# Patient Record
Sex: Female | Born: 1995 | Hispanic: No | Marital: Married | Smoking: Never smoker
Health system: Southern US, Community
[De-identification: ages and names within clinical notes are randomized; demographics above are authoritative.]

## PROBLEM LIST (undated history)

## (undated) DIAGNOSIS — O139 Gestational [pregnancy-induced] hypertension without significant proteinuria, unspecified trimester: Secondary | ICD-10-CM

## (undated) DIAGNOSIS — Z789 Other specified health status: Secondary | ICD-10-CM

## (undated) DIAGNOSIS — K219 Gastro-esophageal reflux disease without esophagitis: Secondary | ICD-10-CM

## (undated) HISTORY — DX: Gestational (pregnancy-induced) hypertension without significant proteinuria, unspecified trimester: O13.9

## (undated) HISTORY — DX: Gastro-esophageal reflux disease without esophagitis: K21.9

---
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
## 2015-02-24 LAB — OB RESULTS CONSOLE GBS: STREP GROUP B AG: NEGATIVE

## 2015-03-30 ENCOUNTER — Encounter (HOSPITAL_COMMUNITY): Payer: Self-pay | Admitting: *Deleted

## 2015-03-30 ENCOUNTER — Inpatient Hospital Stay (HOSPITAL_COMMUNITY)
Admission: AD | Admit: 2015-03-30 | Discharge: 2015-04-03 | DRG: 775 | Disposition: A | Discharge status: Home / Self Care | Payer: Medicaid Other | Source: Ambulatory Visit | Attending: Obstetrics and Gynecology | Admitting: Obstetrics and Gynecology

## 2015-03-30 DIAGNOSIS — O1490 Unspecified pre-eclampsia, unspecified trimester: Secondary | ICD-10-CM | POA: Diagnosis present

## 2015-03-30 DIAGNOSIS — O139 Gestational [pregnancy-induced] hypertension without significant proteinuria, unspecified trimester: Secondary | ICD-10-CM | POA: Diagnosis present

## 2015-03-30 DIAGNOSIS — O480 Post-term pregnancy: Principal | ICD-10-CM | POA: Diagnosis present

## 2015-03-30 DIAGNOSIS — Z3A40 40 weeks gestation of pregnancy: Secondary | ICD-10-CM | POA: Diagnosis present

## 2015-03-30 DIAGNOSIS — Z3480 Encounter for supervision of other normal pregnancy, unspecified trimester: Secondary | ICD-10-CM

## 2015-03-30 DIAGNOSIS — O133 Gestational [pregnancy-induced] hypertension without significant proteinuria, third trimester: Secondary | ICD-10-CM | POA: Diagnosis present

## 2015-03-30 HISTORY — DX: Other specified health status: Z78.9

## 2015-03-30 LAB — OB RESULTS CONSOLE ANTIBODY SCREEN: Antibody Screen: NEGATIVE

## 2015-03-30 LAB — CBC
HEMATOCRIT: 37.7 % (ref 36.0–46.0)
Hemoglobin: 12.5 g/dL (ref 12.0–15.0)
MCH: 29.1 pg (ref 26.0–34.0)
MCHC: 33.2 g/dL (ref 30.0–36.0)
MCV: 87.9 fL (ref 78.0–100.0)
PLATELETS: 289 10*3/uL (ref 150–400)
RBC: 4.29 MIL/uL (ref 3.87–5.11)
RDW: 14.6 % (ref 11.5–15.5)
WBC: 8.5 10*3/uL (ref 4.0–10.5)

## 2015-03-30 LAB — TYPE AND SCREEN
ABO/RH(D): A POS
ANTIBODY SCREEN: NEGATIVE

## 2015-03-30 LAB — COMPREHENSIVE METABOLIC PANEL
ALT: 26 U/L (ref 14–54)
ANION GAP: 11 (ref 5–15)
AST: 30 U/L (ref 15–41)
Albumin: 3.2 g/dL — ABNORMAL LOW (ref 3.5–5.0)
Alkaline Phosphatase: 279 U/L — ABNORMAL HIGH (ref 38–126)
BILIRUBIN TOTAL: 0.2 mg/dL — AB (ref 0.3–1.2)
BUN: 10 mg/dL (ref 6–20)
CALCIUM: 9.4 mg/dL (ref 8.9–10.3)
CO2: 21 mmol/L — ABNORMAL LOW (ref 22–32)
Chloride: 100 mmol/L — ABNORMAL LOW (ref 101–111)
Creatinine, Ser: 0.53 mg/dL (ref 0.44–1.00)
GFR calc Af Amer: >60 mL/min (ref >60)
Glucose, Bld: 74 mg/dL (ref 65–99)
POTASSIUM: 4 mmol/L (ref 3.5–5.1)
Sodium: 132 mmol/L — ABNORMAL LOW (ref 135–145)
TOTAL PROTEIN: 6.7 g/dL (ref 6.5–8.1)

## 2015-03-30 LAB — OB RESULTS CONSOLE ABO/RH: RH TYPE: POSITIVE

## 2015-03-30 LAB — OB RESULTS CONSOLE RPR: RPR: NONREACTIVE

## 2015-03-30 LAB — OB RESULTS CONSOLE HIV ANTIBODY (ROUTINE TESTING): HIV: NONREACTIVE

## 2015-03-30 LAB — LACTATE DEHYDROGENASE: LDH: 142 U/L (ref 98–192)

## 2015-03-30 LAB — OB RESULTS CONSOLE RUBELLA ANTIBODY, IGM: Rubella: IMMUNE

## 2015-03-30 LAB — URIC ACID: Uric Acid, Serum: 5.3 mg/dL (ref 2.3–6.6)

## 2015-03-30 LAB — OB RESULTS CONSOLE HEPATITIS B SURFACE ANTIGEN: Hepatitis B Surface Ag: NEGATIVE

## 2015-03-30 LAB — OB RESULTS CONSOLE GC/CHLAMYDIA
CHLAMYDIA, DNA PROBE: NEGATIVE
GC PROBE AMP, GENITAL: NEGATIVE

## 2015-03-30 MED ORDER — OXYCODONE-ACETAMINOPHEN 5-325 MG PO TABS: 1.0000 | ORAL_TABLET | ORAL | Status: DC | PRN

## 2015-03-30 MED ORDER — MISOPROSTOL 25 MCG QUARTER TABLET
25.0000 ug | ORAL_TABLET | ORAL | Status: DC | PRN
  Administered 2015-03-30: 25 ug via VAGINAL
  Filled 2015-03-30: qty 0.25
  Filled 2015-03-30: qty 1
  Filled 2015-03-30 (×2): qty 0.25

## 2015-03-30 MED ORDER — LACTATED RINGERS IV SOLN
INTRAVENOUS | Status: DC
  Administered 2015-03-30 – 2015-03-31 (×4): via INTRAVENOUS

## 2015-03-30 MED ORDER — ONDANSETRON HCL 4 MG/2ML IJ SOLN: 4.0000 mg | Freq: Four times a day (QID) | INTRAMUSCULAR | Status: DC | PRN

## 2015-03-30 MED ORDER — TERBUTALINE SULFATE 1 MG/ML IJ SOLN
0.2500 mg | Freq: Once | INTRAMUSCULAR | Status: DC | PRN
  Filled 2015-03-30: qty 1

## 2015-03-30 MED ORDER — OXYTOCIN 40 UNITS IN LACTATED RINGERS INFUSION - SIMPLE MED
62.5000 mL/h | INTRAVENOUS | Status: DC
  Filled 2015-03-30: qty 1000

## 2015-03-30 MED ORDER — LACTATED RINGERS IV SOLN
500.0000 mL | INTRAVENOUS | Status: DC | PRN
  Administered 2015-03-31: 500 mL via INTRAVENOUS
  Administered 2015-03-31: 300 mL via INTRAVENOUS

## 2015-03-30 MED ORDER — CITRIC ACID-SODIUM CITRATE 334-500 MG/5ML PO SOLN: 30.0000 mL | ORAL | Status: DC | PRN

## 2015-03-30 MED ORDER — ZOLPIDEM TARTRATE 5 MG PO TABS: 5.0000 mg | ORAL_TABLET | Freq: Every evening | ORAL | Status: DC | PRN

## 2015-03-30 MED ORDER — BUTORPHANOL TARTRATE 1 MG/ML IJ SOLN
1.0000 mg | INTRAMUSCULAR | Status: DC | PRN
  Administered 2015-03-31 (×2): 1 mg via INTRAVENOUS
  Filled 2015-03-30 (×2): qty 1

## 2015-03-30 MED ORDER — ACETAMINOPHEN 325 MG PO TABS: 650.0000 mg | ORAL_TABLET | ORAL | Status: DC | PRN

## 2015-03-30 MED ORDER — OXYCODONE-ACETAMINOPHEN 5-325 MG PO TABS: 2.0000 | ORAL_TABLET | ORAL | Status: DC | PRN

## 2015-03-30 MED ORDER — LIDOCAINE HCL (PF) 1 % IJ SOLN
30.0000 mL | INTRAMUSCULAR | Status: DC | PRN
  Filled 2015-03-30: qty 30

## 2015-03-30 MED ORDER — OXYTOCIN BOLUS FROM INFUSION
500.0000 mL | INTRAVENOUS | Status: DC
  Administered 2015-03-31: 500 mL via INTRAVENOUS

## 2015-03-31 ENCOUNTER — Inpatient Hospital Stay (HOSPITAL_COMMUNITY): Payer: Medicaid Other | Admitting: Anesthesiology

## 2015-03-31 ENCOUNTER — Encounter (HOSPITAL_COMMUNITY): Payer: Self-pay

## 2015-03-31 LAB — CBC
HCT: 34.8 % — ABNORMAL LOW (ref 36.0–46.0)
HEMOGLOBIN: 11.5 g/dL — AB (ref 12.0–15.0)
MCH: 29 pg (ref 26.0–34.0)
MCHC: 33 g/dL (ref 30.0–36.0)
MCV: 87.7 fL (ref 78.0–100.0)
PLATELETS: 250 10*3/uL (ref 150–400)
RBC: 3.97 MIL/uL (ref 3.87–5.11)
RDW: 14.5 % (ref 11.5–15.5)
WBC: 13.5 10*3/uL — AB (ref 4.0–10.5)

## 2015-03-31 LAB — RPR: RPR Ser Ql: NONREACTIVE

## 2015-03-31 LAB — ABO/RH: ABO/RH(D): A POS

## 2015-03-31 MED ORDER — DIPHENHYDRAMINE HCL 50 MG/ML IJ SOLN: 12.5000 mg | INTRAMUSCULAR | Status: DC | PRN

## 2015-03-31 MED ORDER — ACETAMINOPHEN 500 MG PO TABS
1000.0000 mg | ORAL_TABLET | Freq: Four times a day (QID) | ORAL | Status: DC | PRN
  Administered 2015-03-31: 1000 mg via ORAL
  Filled 2015-03-31: qty 2

## 2015-03-31 MED ORDER — SODIUM CHLORIDE 0.9 % IV SOLN: 2.0000 g | Freq: Four times a day (QID) | INTRAVENOUS | Status: DC

## 2015-03-31 MED ORDER — PHENYLEPHRINE 40 MCG/ML (10ML) SYRINGE FOR IV PUSH (FOR BLOOD PRESSURE SUPPORT)
80.0000 ug | PREFILLED_SYRINGE | INTRAVENOUS | Status: DC | PRN
  Filled 2015-03-31: qty 2
  Filled 2015-03-31: qty 20

## 2015-03-31 MED ORDER — LIDOCAINE HCL (PF) 1 % IJ SOLN
INTRAMUSCULAR | Status: DC | PRN
  Administered 2015-03-31 (×2): 4 mL

## 2015-03-31 MED ORDER — SODIUM CHLORIDE 0.9 % IV SOLN
2.0000 g | Freq: Four times a day (QID) | INTRAVENOUS | Status: DC
  Administered 2015-03-31: 2 g via INTRAVENOUS
  Filled 2015-03-31 (×4): qty 2000

## 2015-03-31 MED ORDER — EPHEDRINE 5 MG/ML INJ
10.0000 mg | INTRAVENOUS | Status: DC | PRN
  Filled 2015-03-31: qty 2

## 2015-03-31 MED ORDER — OXYTOCIN 40 UNITS IN LACTATED RINGERS INFUSION - SIMPLE MED
1.0000 m[IU]/min | INTRAVENOUS | Status: DC
  Administered 2015-03-31: 2 m[IU]/min via INTRAVENOUS
  Administered 2015-03-31: 8 m[IU]/min via INTRAVENOUS

## 2015-03-31 MED ORDER — FENTANYL 2.5 MCG/ML BUPIVACAINE 1/10 % EPIDURAL INFUSION (WH - ANES)
14.0000 mL/h | INTRAMUSCULAR | Status: DC | PRN
  Administered 2015-03-31 (×3): 14 mL/h via EPIDURAL
  Filled 2015-03-31 (×2): qty 125

## 2015-03-31 MED ORDER — TERBUTALINE SULFATE 1 MG/ML IJ SOLN
0.2500 mg | Freq: Once | INTRAMUSCULAR | Status: DC | PRN
  Filled 2015-03-31: qty 1

## 2015-03-31 NOTE — Anesthesia Procedure Notes (Signed)
Epidural Patient location during procedure: OB  Staffing Anesthesiologist: Rosealynn Mateus Performed by: anesthesiologist   Preanesthetic Checklist Completed: patient identified, site marked, surgical consent, pre-op evaluation, timeout performed, IV checked, risks and benefits discussed and monitors and equipment checked  Epidural Patient position: sitting Prep: site prepped and draped and DuraPrep Patient monitoring: continuous pulse ox and blood pressure Approach: midline Location: L3-L4 Injection technique: LOR saline  Needle:  Needle type: Tuohy  Needle gauge: 17 G Needle length: 9 cm and 9 Needle insertion depth: 5 cm cm Catheter type: closed end flexible Catheter size: 19 Gauge Catheter at skin depth: 10 cm Test dose: negative  Assessment Events: blood not aspirated, injection not painful, no injection resistance, negative IV test and no paresthesia  Additional Notes Patient identified. Risks/Benefits/Options discussed with patient including but not limited to bleeding, infection, nerve damage, paralysis, failed block, incomplete pain control, headache, blood pressure changes, nausea, vomiting, reactions to medication both or allergic, itching and postpartum back pain. Confirmed with bedside nurse the patient's most recent platelet count. Confirmed with patient that they are not currently taking any anticoagulation, have any bleeding history or any family history of bleeding disorders. Patient expressed understanding and wished to proceed. All questions were answered. Sterile technique was used throughout the entire procedure. Please see nursing notes for vital signs. Test dose was given through epidural catheter and negative prior to continuing to dose epidural or start infusion. Warning signs of high block given to the patient including shortness of breath, tingling/numbness in hands, complete motor block, or any concerning symptoms with instructions to call for help. Patient was  given instructions on fall risk and not to get out of bed. All questions and concerns addressed with instructions to call with any issues or inadequate analgesia.      

## 2015-03-31 NOTE — Anesthesia Preprocedure Evaluation (Signed)
Anesthesia Evaluation  Patient identified by MRN, date of birth, ID band Patient awake    Reviewed: Allergy & Precautions, NPO status , Patient's Chart, lab work & pertinent test results  History of Anesthesia Complications Negative for: history of anesthetic complications  Airway Mallampati: II  TM Distance: >3 FB Neck ROM: Full    Dental no notable dental hx. (+) Dental Advisory Given   Pulmonary neg pulmonary ROS,    Pulmonary exam normal breath sounds clear to auscultation       Cardiovascular hypertension, Normal cardiovascular exam Rhythm:Regular Rate:Normal     Neuro/Psych negative neurological ROS  negative psych ROS   GI/Hepatic negative GI ROS, Neg liver ROS,   Endo/Other  negative endocrine ROS  Renal/GU negative Renal ROS  negative genitourinary   Musculoskeletal negative musculoskeletal ROS (+)   Abdominal   Peds negative pediatric ROS (+)  Hematology negative hematology ROS (+)   Anesthesia Other Findings   Reproductive/Obstetrics (+) Pregnancy                             Anesthesia Physical Anesthesia Plan  ASA: II  Anesthesia Plan: Epidural   Post-op Pain Management:    Induction:   Airway Management Planned:   Additional Equipment:   Intra-op Plan:   Post-operative Plan:   Informed Consent: I have reviewed the patients History and Physical, chart, labs and discussed the procedure including the risks, benefits and alternatives for the proposed anesthesia with the patient or authorized representative who has indicated his/her understanding and acceptance.   Dental advisory given  Plan Discussed with: CRNA  Anesthesia Plan Comments:         Anesthesia Quick Evaluation  

## 2015-03-31 NOTE — H&P (Signed)
Diamond Smith is a 19 y.o. female presenting for induction of labor for gestational hypertension  19 yo G2P0 At 4+2 sent over from the office for induction of labor for gestational hypertension. The patient had been seen yesterday for a routine prenatal visit in the office yesterday. Her initial BP was 150/90. She returned to the office for an appointment today and her initial BP was 140/90 therefore she was sent for induction History OB History    Gravida Para Term Preterm AB TAB SAB Ectopic Multiple Living       Past Medical History  Diagnosis Date  . Medical history non-contributory    History reviewed. No pertinent past surgical history. Family History: family history is not on file. Social History:  reports that she has never smoked. She does not have any smokeless tobacco history on file. She reports that she does not drink alcohol. Her drug history is not on file.   Prenatal Transfer Tool  Maternal Diabetes: No Genetic Screening: Normal Maternal Ultrasounds/Referrals: Normal Fetal Ultrasounds or other Referrals:  None Maternal Substance Abuse:  No Significant Maternal Medications:  None Significant Maternal Lab Results:  None Other Comments:  None  ROS  Dilation: 1 Effacement (%): 50 Station: -2, -3 Exam by:: N Deal RN Blood pressure 132/86, pulse 83, temperature 97.6 F (36.4 C), temperature source Oral, resp. rate 18, height 5' (1.524 m), weight 78.019 kg (172 lb), last menstrual period 06/21/2014. Exam Physical Exam  Prenatal labs: ABO, Rh: --/--/A POS, A POS (09/13 1900) Antibody: NEG (09/13 1900) Rubella: Immune (09/13 1807) RPR: Nonreactive (09/13 1807)  HBsAg: Negative (09/13 1807)  HIV: Non-reactive (09/13 1807)  GBS: Negative (08/10 0000)   Assessment/Plan: 1) Admit 2) PIH labs 3) Epidural on request 4) Misoprostal PV Q 4 hrs 5) antihypertensives as needed  Diamond Smith H. 03/31/2015, 8:00 AM

## 2015-04-01 ENCOUNTER — Encounter (HOSPITAL_COMMUNITY): Payer: Self-pay

## 2015-04-01 DIAGNOSIS — Z3480 Encounter for supervision of other normal pregnancy, unspecified trimester: Secondary | ICD-10-CM

## 2015-04-01 LAB — CBC
HCT: 29.9 % — ABNORMAL LOW (ref 36.0–46.0)
Hemoglobin: 9.8 g/dL — ABNORMAL LOW (ref 12.0–15.0)
MCH: 28.6 pg (ref 26.0–34.0)
MCHC: 32.8 g/dL (ref 30.0–36.0)
MCV: 87.2 fL (ref 78.0–100.0)
PLATELETS: 217 10*3/uL (ref 150–400)
RBC: 3.43 MIL/uL — ABNORMAL LOW (ref 3.87–5.11)
RDW: 14.7 % (ref 11.5–15.5)
WBC: 15.7 10*3/uL — ABNORMAL HIGH (ref 4.0–10.5)

## 2015-04-01 MED ORDER — OXYCODONE-ACETAMINOPHEN 5-325 MG PO TABS: 1.0000 | ORAL_TABLET | ORAL | Status: DC | PRN

## 2015-04-01 MED ORDER — ACETAMINOPHEN 325 MG PO TABS
650.0000 mg | ORAL_TABLET | ORAL | Status: DC | PRN
Start: 2015-04-01 — End: 2015-04-03
  Administered 2015-04-02: 650 mg via ORAL
  Filled 2015-04-01: qty 2

## 2015-04-01 MED ORDER — MEASLES, MUMPS & RUBELLA VAC ~~LOC~~ INJ
0.5000 mL | INJECTION | Freq: Once | SUBCUTANEOUS | Status: DC
  Filled 2015-04-01: qty 0.5

## 2015-04-01 MED ORDER — TETANUS-DIPHTH-ACELL PERTUSSIS 5-2.5-18.5 LF-MCG/0.5 IM SUSP: 0.5000 mL | Freq: Once | INTRAMUSCULAR | Status: DC

## 2015-04-01 MED ORDER — PRENATAL MULTIVITAMIN CH
1.0000 | ORAL_TABLET | Freq: Every day | ORAL | Status: DC
  Administered 2015-04-01 – 2015-04-02 (×2): 1 via ORAL
  Filled 2015-04-01 (×2): qty 1

## 2015-04-01 MED ORDER — ZOLPIDEM TARTRATE 5 MG PO TABS
5.0000 mg | ORAL_TABLET | Freq: Every evening | ORAL | Status: DC | PRN
Start: 2015-04-01 — End: 2015-04-03

## 2015-04-01 MED ORDER — SIMETHICONE 80 MG PO CHEW: 80.0000 mg | CHEWABLE_TABLET | ORAL | Status: DC | PRN

## 2015-04-01 MED ORDER — IBUPROFEN 600 MG PO TABS
600.0000 mg | ORAL_TABLET | Freq: Four times a day (QID) | ORAL | Status: DC
  Administered 2015-04-01 – 2015-04-03 (×8): 600 mg via ORAL
  Filled 2015-04-01 (×9): qty 1

## 2015-04-01 MED ORDER — INFLUENZA VAC SPLIT QUAD 0.5 ML IM SUSY: 0.5000 mL | PREFILLED_SYRINGE | INTRAMUSCULAR | Status: DC

## 2015-04-01 MED ORDER — ONDANSETRON HCL 4 MG/2ML IJ SOLN: 4.0000 mg | INTRAMUSCULAR | Status: DC | PRN

## 2015-04-01 MED ORDER — BENZOCAINE-MENTHOL 20-0.5 % EX AERO
1.0000 "application " | INHALATION_SPRAY | CUTANEOUS | Status: DC | PRN
  Administered 2015-04-01: 1 via TOPICAL
  Filled 2015-04-01: qty 56

## 2015-04-01 MED ORDER — WITCH HAZEL-GLYCERIN EX PADS: 1.0000 "application " | MEDICATED_PAD | CUTANEOUS | Status: DC | PRN

## 2015-04-01 MED ORDER — ONDANSETRON HCL 4 MG PO TABS
4.0000 mg | ORAL_TABLET | ORAL | Status: DC | PRN
Start: 2015-04-01 — End: 2015-04-03

## 2015-04-01 MED ORDER — SENNOSIDES-DOCUSATE SODIUM 8.6-50 MG PO TABS
2.0000 | ORAL_TABLET | ORAL | Status: DC
  Administered 2015-04-01 – 2015-04-02 (×2): 2 via ORAL
  Filled 2015-04-01 (×2): qty 2

## 2015-04-01 MED ORDER — OXYCODONE-ACETAMINOPHEN 5-325 MG PO TABS: 2.0000 | ORAL_TABLET | ORAL | Status: DC | PRN

## 2015-04-01 MED ORDER — DIBUCAINE 1 % RE OINT: 1.0000 "application " | TOPICAL_OINTMENT | RECTAL | Status: DC | PRN

## 2015-04-01 NOTE — Progress Notes (Addendum)
Patient is eating, ambulating, voiding.  Pain control is good.  Filed Vitals:   04/01/15 0201 04/01/15 0216 04/01/15 0320 04/01/15 0420  BP: 111/57 114/66 106/61 112/71  Pulse: 92 88 79 96  Temp:   98.8 F (37.1 C) 98.7 F (37.1 C)  TempSrc:   Oral Oral  Resp: Height:      Weight:      SpO2:        Fundus firm Perineum with some swelling but voiding ok.  Lab Results  Component Value Date   WBC 15.7* 04/01/2015   HGB 9.8* 04/01/2015   HCT 29.9* 04/01/2015   MCV 87.2 04/01/2015   PLT 217 04/01/2015    --/--/A POS, A POS (09/13 1900)/RI  A/P Post partum day 0.  Routine care.  Expect d/c routine.  Declines circ.  Rishav Rockefeller A

## 2015-04-01 NOTE — Lactation Note (Signed)
This note was copied from the chart of Diamond Norma Montemurro. Lactation Consultation Note  Initial Consulation on 15 hour old infant. Infant awake and fussy in moms arms. She is attempting to latch without success. Assisted mom with Positioning. Infant would attempt to latch but not stay on. Brief non nutritive sucking episodes noted without swallows. Attempted in cradle and football holds. Awakening techniques taught. Infant noted to have 1 stool and no void since birth. Taught mom to hand express, obtained 1 cc expressed BM and fed in curved tip syringe while attempting to latch. Normal NB behavior reviewed with mom. Infant was fussy the entire time I was in the room (30 + minutes) with a groaning/grunting sound. Mom/Baby nurse Maralyn Sago was notified, when Maralyn Sago was at bedside infant was asleep while STS with mom and was not making any noises. RR was 56 and 60 while awake and no retractions were seen when awake or asleep. Encouraged mom to keep baby SNS, awaken and attempt BF q 2-3 hours or upon cues using STS,  hand expression , massage, and breast compression before and during feeding. Enc. Mom to aim for 8-12 feedings in 24 hours and keep I/O record. Baby was passing gas when I was working with him also. LC Handouts given and mom notified of LC OP services, LC Phone #, Breastfeeding resources and Support Groups. Mom encouraged to call PRN questions/concerns.  Patient Name: Diamond Smith [id number] Date: 04/01/2015 Reason for consult: Initial assessment   Maternal Data    Feeding Feeding Type: Breast Fed Length of feed: 0 min  LATCH Score/Interventions Latch: Too sleepy or reluctant, no latch achieved, no sucking elicited. Intervention(s): Skin to skin;Teach feeding cues;Waking techniques Intervention(s): Adjust position;Assist with latch;Breast massage;Breast compression  Audible Swallowing: None Intervention(s): Skin to skin;Hand expression  Type of Nipple: Everted at rest and after  stimulation  Comfort (Breast/Nipple): Soft / non-tender     Hold (Positioning): Assistance needed to correctly position infant at breast and maintain latch.  LATCH Score: 5  Lactation Tools Discussed/Used Tools: Medicine Dropper   Consult Status Consult Status: Follow-up Date: 04/02/15 Follow-up type: In-patient    Silas Flood Tywanna Seifer 04/01/2015, 4:18 PM

## 2015-04-01 NOTE — Anesthesia Postprocedure Evaluation (Signed)
Anesthesia Post Note  Patient: Diamond Smith  Procedure(s) Performed: * No procedures listed *  Anesthesia type: Epidural  Patient location: Mother/Baby  Post pain: Pain level controlled  Post assessment: Post-op Vital signs reviewed  Last Vitals:  Filed Vitals:   04/01/15 0420  BP: 112/71  Pulse: 96  Temp: 37.1 C  Resp: 18    Post vital signs: Reviewed  Level of consciousness:alert  Complications: No apparent anesthesia complications

## 2015-04-02 NOTE — Progress Notes (Signed)
Pt BP 163/71 and 158/93 pt is not symptomatic. Claiborne Billings notified and would like vital signs to be taken q2hours and will reassess. Will continue to monitor pt.

## 2015-04-02 NOTE — Progress Notes (Signed)
Patient is eating, ambulating, voiding.  Pain control is good.  Appropriate lochia, no complaints.  Filed Vitals:   04/01/15 0820 04/01/15 1648 04/02/15 0540 04/02/15 1100  BP: 117/75 135/86 131/81 125/78  Pulse: 72 95 71 95  Temp: 98.2 F (36.8 C) 98 F (36.7 C) 97.7 F (36.5 C) 98.4 F (36.9 C)  TempSrc: Oral Oral Oral Oral  Resp: Height:      Weight:      SpO2:    98%    Fundus firm Perineum without swelling. Ext: no CT  Lab Results  Component Value Date   WBC 15.7* 04/01/2015   HGB 9.8* 04/01/2015   HCT 29.9* 04/01/2015   MCV 87.2 04/01/2015   PLT 217 04/01/2015    --/--/A POS, A POS (09/13 1900)  A/P Post partum day 1. No circ desired Pt unsure if she'd like to stay until tomorrow  Routine care.    Philip Aspen

## 2015-04-03 NOTE — Discharge Summary (Addendum)
Obstetric Discharge Summary Reason for Admission: induction of labor- GHTN Prenatal Procedures: Preeclampsia and ultrasound Intrapartum Procedures: spontaneous vaginal delivery- IV abx for maternal temp Postpartum Procedures: none Complications-Operative and Postpartum: right labial HEMOGLOBIN  Date Value Ref Range Status  04/01/2015 9.8* 12.0 - 15.0 g/dL Final   HCT  Date Value Ref Range Status  04/01/2015 29.9* 36.0 - 46.0 % Final    Physical Exam:  General: alert and cooperative Lochia: appropriate Uterine Fundus: firm DVT Evaluation: No evidence of DVT seen on physical exam.  Discharge Diagnoses: Term Pregnancy-delivered  Discharge Information: Date: 04/03/2015 Activity: pelvic rest Diet: routine Medications: PNV and Ibuprofen Condition: stable Instructions: refer to practice specific booklet Discharge to: home Follow-up Information    Follow up with Levi Aland, MD In 1 week.   Specialty:  Obstetrics and Gynecology   Contact information:   [address] [place] [address] [phone]       Newborn Data: Live born female  Birth Weight: 6 lb 8.4 oz (2960 g) APGAR: 9, 9  Home with mother.  Philip Aspen 04/03/2015, 9:22 AM

## 2017-09-14 DIAGNOSIS — K219 Gastro-esophageal reflux disease without esophagitis: Secondary | ICD-10-CM | POA: Insufficient documentation

## 2018-06-21 DIAGNOSIS — R87612 Low grade squamous intraepithelial lesion on cytologic smear of cervix (LGSIL): Secondary | ICD-10-CM | POA: Insufficient documentation

## 2018-09-09 ENCOUNTER — Other Ambulatory Visit: Payer: Self-pay

## 2018-09-09 ENCOUNTER — Encounter (HOSPITAL_BASED_OUTPATIENT_CLINIC_OR_DEPARTMENT_OTHER): Payer: Self-pay | Admitting: *Deleted

## 2018-09-09 ENCOUNTER — Emergency Department (HOSPITAL_BASED_OUTPATIENT_CLINIC_OR_DEPARTMENT_OTHER)
Admission: EM | Admit: 2018-09-09 | Discharge: 2018-09-09 | Disposition: A | Discharge status: Home / Self Care | Payer: Self-pay | Attending: Emergency Medicine | Admitting: Emergency Medicine

## 2018-09-09 ENCOUNTER — Emergency Department (HOSPITAL_BASED_OUTPATIENT_CLINIC_OR_DEPARTMENT_OTHER): Payer: Self-pay

## 2018-09-09 DIAGNOSIS — R109 Unspecified abdominal pain: Secondary | ICD-10-CM

## 2018-09-09 DIAGNOSIS — R1031 Right lower quadrant pain: Secondary | ICD-10-CM | POA: Insufficient documentation

## 2018-09-09 LAB — PREGNANCY, URINE: Preg Test, Ur: NEGATIVE

## 2018-09-09 LAB — URINALYSIS, ROUTINE W REFLEX MICROSCOPIC
Bilirubin Urine: NEGATIVE
GLUCOSE, UA: NEGATIVE mg/dL
Hgb urine dipstick: NEGATIVE
Ketones, ur: 15 mg/dL — AB
Nitrite: NEGATIVE
PH: 5.5 (ref 5.0–8.0)
Protein, ur: NEGATIVE mg/dL
SPECIFIC GRAVITY, URINE: 1.01 (ref 1.005–1.030)

## 2018-09-09 LAB — URINALYSIS, MICROSCOPIC (REFLEX)

## 2018-09-09 MED ORDER — IBUPROFEN 800 MG PO TABS: 800.0000 mg | ORAL_TABLET | Freq: Three times a day (TID) | ORAL | 0 refills | Status: DC | PRN

## 2018-09-09 MED ORDER — LIDOCAINE 5 % EX OINT: 1.0000 "application " | TOPICAL_OINTMENT | Freq: Three times a day (TID) | CUTANEOUS | 0 refills | Status: DC | PRN

## 2018-09-09 NOTE — Discharge Instructions (Signed)

## 2018-09-09 NOTE — ED Triage Notes (Signed)
Back pain for a week. She was seen at [facility] and told to come here. She was treated recently for a UTI. She has had several negative pregnancy test.

## 2018-09-09 NOTE — ED Notes (Signed)
Right lower back pain for a week, denies dysuria, denies n/v/d

## 2018-09-09 NOTE — ED Notes (Signed)
Pt verbalizes understanding of d/c instructions and denies any further needs at this time. 

## 2018-09-09 NOTE — ED Provider Notes (Signed)
Emergency Department Provider Note   I have reviewed the triage vital signs and the nursing notes.   HISTORY  Chief Complaint Back Pain   HPI Diamond Smith is a 23 y.o. female with no significant past medical history presents to the emergency department with right flank pain.  Patient has had moderate to severe intermittent pain over the past week.  She had some left lower quadrant abdominal pain earlier in the month but was treated with Cipro for presumed UTI and improved.  She denies any dysuria, hesitancy, urgency.  No vaginal bleeding or discharge.  Patient states that she is been seen by her OB who performed a pelvic exam and swabs with no clear findings.  She was seen in urgent care this evening and referred to the emergency department for evaluation of possible kidney stone.  Patient denies any anterior domino discomfort.  No chest pain or shortness of breath.  No numbness or weakness in the legs.  No difficulty walking.  No injury.   Past Medical History:  Diagnosis Date  . Medical history non-contributory     Patient Active Problem List   Diagnosis Date Noted  . Supervision of other normal pregnancy 04/01/2015  . Gestational hypertension 03/30/2015    History reviewed. No pertinent surgical history.  Current Outpatient Rx  . Order #: [id number] Class: Print  . Order #: [id number] Class: Print  . Order #: [id number] Class: Historical Med    Allergies Patient has no known allergies.  No family history on file.  Social History Social History   Tobacco Use  . Smoking status: Never Smoker  . Smokeless tobacco: Never Used  Substance Use Topics  . Alcohol use: No  . Drug use: Not on file    Review of Systems  Constitutional: No fever/chills Eyes: No visual changes. ENT: No sore throat. Cardiovascular: Denies chest pain. Respiratory: Denies shortness of breath. Gastrointestinal: No abdominal pain.  No nausea, no vomiting.  No diarrhea.  No  constipation. Genitourinary: Negative for dysuria. No vaginal discharge or bleeding.  Musculoskeletal: Positive right flank/back pain.  Skin: Negative for rash. Neurological: Negative for headaches, focal weakness or numbness.  10-point ROS otherwise negative.  ____________________________________________   PHYSICAL EXAM:  VITAL SIGNS: ED Triage Vitals  Enc Vitals Group     BP 09/09/18 1716 120/88     Pulse Rate 09/09/18 1716 86     Resp 09/09/18 1716 18     Temp 09/09/18 1716 98.3 F (36.8 C)     Temp Source 09/09/18 1716 Oral     SpO2 09/09/18 1716 99 %     Weight 09/09/18 1713 147 lb 7.8 oz (66.9 kg)     Height 09/09/18 1713 5\' 1"  (1.549 m)   Constitutional: Alert and oriented. Well appearing and in no acute distress. Eyes: Conjunctivae are normal.  Head: Atraumatic. Nose: No congestion/rhinnorhea. Mouth/Throat: Mucous membranes are moist.  Neck: No stridor.  Cardiovascular: Normal rate, regular rhythm. Good peripheral circulation. Grossly normal heart sounds.   Respiratory: Normal respiratory effort.  No retractions. Lungs CTAB. Gastrointestinal: Soft and nontender. No distention. No CVA tenderness.  Musculoskeletal: No lower extremity tenderness nor edema. No gross deformities of extremities. Neurologic:  Normal speech and language. No gross focal neurologic deficits are appreciated.  Skin:  Skin is warm, dry and intact. No rash noted.  ____________________________________________   LABS (all labs ordered are listed, but only abnormal results are displayed)  Labs Reviewed  URINALYSIS, ROUTINE W REFLEX MICROSCOPIC - Abnormal; Notable for the following  components:      Result Value   APPearance HAZY (*)    Ketones, ur 15 (*)    Leukocytes,Ua SMALL (*)    All other components within normal limits  URINALYSIS, MICROSCOPIC (REFLEX) - Abnormal; Notable for the following components:   Bacteria, UA FEW (*)    All other components within normal limits  URINE CULTURE   PREGNANCY, URINE   ____________________________________________  RADIOLOGY  Ct Renal Stone Study  Result Date: 09/09/2018 CLINICAL DATA:  Intermittent right lower quadrant pain with urinary frequency. Concern for nephrolithiasis. EXAM: CT ABDOMEN AND PELVIS WITHOUT CONTRAST TECHNIQUE: Multidetector CT imaging of the abdomen and pelvis was performed following the standard protocol without IV contrast. COMPARISON:  None. FINDINGS: Lower chest: No acute abnormality. Hepatobiliary: No focal liver abnormality is seen. No gallstones, gallbladder wall thickening, or biliary dilatation. Pancreas: Unremarkable. No pancreatic ductal dilatation or surrounding inflammatory changes. Spleen: Normal in size without focal abnormality. Adrenals/Urinary Tract: Adrenal glands are unremarkable. Kidneys are normal, without renal calculi, focal lesion, or hydronephrosis. Bladder is unremarkable. Stomach/Bowel: Stomach is within normal limits. Appendix appears normal. No evidence of bowel wall thickening, distention, or inflammatory changes. Vascular/Lymphatic: Limited assessment of the vasculature without contrast. No aneurysm. No retroperitoneal hemorrhage or hematoma. No bulky adenopathy. Reproductive: Uterus normal in size and retroverted. No adnexal abnormality. No pelvic free fluid. Other: No abdominal wall hernia or abnormality. No abdominopelvic ascites. Musculoskeletal: No acute or significant osseous finding. IMPRESSION: No acute obstructing urinary tract calculus, hydronephrosis, or obstructive uropathy. No other acute intra-abdominopelvic finding by noncontrast CT. Electronically Signed   By: Judie Petit.  Shick M.D.   On: 09/09/2018 20:19    ____________________________________________   PROCEDURES  Procedure(s) performed:   Procedures  None ____________________________________________   INITIAL IMPRESSION / ASSESSMENT AND PLAN / ED COURSE  Pertinent labs & imaging results that were available during my care  of the patient were reviewed by me and considered in my medical decision making (see chart for details).  Urgency department with right flank pain.  No CVA tenderness.  Patient is ambulatory in the room and appears well.  Normal anterior abdominal exam.  No vaginal bleeding or discharge.  Plan for CT renal.  UA with small leukocytes and few bacteria.  No blood.   CT renal reviewed with no acute findings.  Patient has few bacteria on UA urine small.  No dysuria.  No vaginal bleeding or discharge.  Patient reports recent pelvic exam with swabs and has follow-up with OB for ultrasound.  I do not have concern for ovarian torsion and do not feel she requires emergency ultrasound or other imaging at this time.  Discussed ED return precautions and provided Motrin along with topical lidocaine for flank discomfort. ____________________________________________  FINAL CLINICAL IMPRESSION(S) / ED DIAGNOSES  Final diagnoses:  Right flank pain    NEW OUTPATIENT MEDICATIONS STARTED DURING THIS VISIT:  Discharge Medication List as of 09/09/2018  8:55 PM    START taking these medications   Details  ibuprofen (ADVIL,MOTRIN) 800 MG tablet Take 1 tablet (800 mg total) by mouth every 8 (eight) hours as needed for moderate pain or cramping., Starting Mon 09/09/2018, Print    lidocaine (XYLOCAINE) 5 % ointment Apply 1 application topically 3 (three) times daily as needed., Starting Mon 09/09/2018, Print        Note:  This document was prepared using Dragon voice recognition software and may include unintentional dictation errors.  Alona Bene, MD Emergency Medicine    Nicie Milan, Arlyss Repress,  MD 09/10/18 [phone]

## 2018-09-11 LAB — URINE CULTURE: Special Requests: NORMAL

## 2018-09-12 ENCOUNTER — Other Ambulatory Visit: Payer: Self-pay | Admitting: Family Medicine

## 2018-09-12 ENCOUNTER — Ambulatory Visit: Payer: Self-pay | Admitting: Family Medicine

## 2018-09-12 ENCOUNTER — Encounter: Payer: Self-pay | Admitting: Family Medicine

## 2018-09-12 ENCOUNTER — Telehealth: Payer: Self-pay | Admitting: Family Medicine

## 2018-09-12 DIAGNOSIS — M545 Low back pain, unspecified: Secondary | ICD-10-CM | POA: Insufficient documentation

## 2018-09-12 MED ORDER — METHYLPREDNISOLONE 4 MG PO TBPK: ORAL_TABLET | ORAL | 0 refills | Status: DC

## 2018-09-12 NOTE — Telephone Encounter (Signed)
Rx for medrol dosepak sent in.

## 2018-09-12 NOTE — Telephone Encounter (Signed)
Copied from CRM [id number]. Topic: Quick Communication - See Telephone Encounter >> Sep 12, 2018  4:31 PM Jens Som A wrote: CRM for notification. See Telephone encounter for: 09/12/18.  Patient is calling because she is having pain and leg pain. Patient was in today. And she is requesting a script for pain medication. Please advise. Thank you. [id number] - [place], [place] - [address] AT [facility] [address] [phone] (Phone) [phone] (Fax)

## 2018-09-12 NOTE — Progress Notes (Signed)
Diamond Smith - 23 y.o. female MRN [id number]  Date of birth: 1996/06/29  Subjective Chief Complaint  Patient presents with  . Back Pain    ongoing for two weeks. Has been taking Motrin. Denies injury. Located lower right back radiates to her leg.    HPI Diamond Smith is a 23 y.o. female here today with complaint of low back pain.  She reports that pain began about two weeks ago.  She has been seen by previous PCP, urgent care, Ob-GYN and ER for this issue.  She has had multiple UA, pregnancy tests, cervical swabs and imaging completed that have all been negative.  Notes from ER and OB-GYN reviewed.  She describes pain as sharp/burning pain that radiates from R side of lower back to anterior thigh.  She denies numbness or tingling.  She has no weakness of the leg.  She was prescribed ibuprofen but didn't have much improvement with this.  She did recently start taking a vitamin d supplement and interestingly enough had improvement after starting this.  Her pain today is the best it has been.    ROS:  A comprehensive ROS was completed and negative except as noted per HPI  No Known Allergies  Past Medical History:  Diagnosis Date  . Medical history non-contributory     History reviewed. No pertinent surgical history.  Social History   Socioeconomic History  . Marital status: Married    Spouse name: Not on file  . Number of children: Not on file  . Years of education: Not on file  . Highest education level: Not on file  Occupational History  . Not on file  Social Needs  . Financial resource strain: Not on file  . Food insecurity:    Worry: Not on file    Inability: Not on file  . Transportation needs:    Medical: Not on file    Non-medical: Not on file  Tobacco Use  . Smoking status: Never Smoker  . Smokeless tobacco: Never Used  Substance and Sexual Activity  . Alcohol use: No  . Drug use: Not on file  . Sexual activity: Yes  Lifestyle  . Physical activity:    Days per  week: Not on file    Minutes per session: Not on file  . Stress: Not on file  Relationships  . Social connections:    Talks on phone: Not on file    Gets together: Not on file    Attends religious service: Not on file    Active member of club or organization: Not on file    Attends meetings of clubs or organizations: Not on file    Relationship status: Not on file  Other Topics Concern  . Not on file  Social History Narrative  . Not on file    Family History  Problem Relation Age of Onset  . Hypertension Mother   . Hyperlipidemia Mother     Health Maintenance  Topic Date Due  . Janet Berlin  11/10/2014  . CHLAMYDIA SCREENING  03/29/2016  . PAP-Cervical Cytology Screening  11/09/2016  . PAP SMEAR-Modifier  11/09/2016  . INFLUENZA VACCINE  10/15/2018 (Originally 02/14/2018)  . HIV Screening  Completed    ----------------------------------------------------------------------------------------------------------------------------------------------------------------------------------------------------------------- Physical Exam BP 112/62   Pulse 73   Temp 98.2 F (36.8 C) (Oral)   Ht 5\' 1"  (1.549 m)   Wt 147 lb (66.7 kg)   LMP 09/10/2018   SpO2 99%   BMI 27.78 kg/m   Physical Exam Constitutional:  Appearance: Normal appearance.  HENT:     Head: Normocephalic and atraumatic.  Eyes:     General: No scleral icterus. Cardiovascular:     Rate and Rhythm: Normal rate and regular rhythm.  Pulmonary:     Effort: Pulmonary effort is normal.     Breath sounds: Normal breath sounds.  Abdominal:     General: There is no distension.     Palpations: Abdomen is soft.     Tenderness: There is no abdominal tenderness. There is no right CVA tenderness or left CVA tenderness.  Musculoskeletal:     Comments: Lumbar spine with normal ROM.  Non-tender to palpation.  Strength of RLE is 5/5 with normal sensation.   Skin:    General: Skin is warm and dry.  Neurological:      General: No focal deficit present.     Mental Status: She is alert.  Psychiatric:        Mood and Affect: Mood normal.        Behavior: Behavior normal.     ------------------------------------------------------------------------------------------------------------------------------------------------------------------------------------------------------------------- Assessment and Plan  Low back pain -LBP with radiation to anterior thigh consistent with possible meralgia paresthetica as work up for other etiology has been negative. -Discussed postural and positional changes.  -Given HEP for low back pain.  -Discussed trial of steroid however she would like to continue vitamin d for now as she has had improvement.  She will let me know if worsening again.

## 2018-09-12 NOTE — Assessment & Plan Note (Signed)
-  LBP with radiation to anterior thigh consistent with possible meralgia paresthetica as work up for other etiology has been negative. -Discussed postural and positional changes.  -Given HEP for low back pain.  -Discussed trial of steroid however she would like to continue vitamin d for now as she has had improvement.  She will let me know if worsening again.

## 2018-09-12 NOTE — Patient Instructions (Signed)
Ask your health care provider which exercises are safe for you. Do exercises exactly as told by your health care provider and adjust them as directed. It is normal to feel mild stretching, pulling, tightness, or discomfort as you do these exercises, but you should stop right away if you feel sudden pain or your pain gets worse. Do not begin these exercises until told by your health care provider. Stretching and range of motion exercises These exercises warm up your muscles and joints and improve the movement and flexibility of your back. These exercises also help to relieve pain, numbness, and tingling. Exercise A: Single knee to chest  1. Lie on your back on a firm surface with both legs straight. 2. Bend one of your knees. Use your hands to move your knee up toward your chest until you feel a gentle stretch in your lower back and buttock. ? Hold your leg in this position by holding onto the front of your knee. ? Keep your other leg as straight as possible. 3. Hold for __________ seconds. 4. Slowly return to the starting position. 5. Repeat with your other leg. Repeat __________ times. Complete this exercise __________ times a day. Exercise B: Prone extension on elbows  1. Lie on your abdomen on a firm surface. 2. Prop yourself up on your elbows. 3. Use your arms to help lift your chest up until you feel a gentle stretch in your abdomen and your lower back. ? This will place some of your body weight on your elbows. If this is uncomfortable, try stacking pillows under your chest. ? Your hips should stay down, against the surface that you are lying on. Keep your hip and back muscles relaxed. 4. Hold for __________ seconds. 5. Slowly relax your upper body and return to the starting position. Repeat __________ times. Complete this exercise __________ times a day. Strengthening exercises These exercises build strength and endurance in your back. Endurance is the ability to use your muscles for  a long time, even after they get tired. Exercise C: Pelvic tilt 1. Lie on your back on a firm surface. Bend your knees and keep your feet flat. 2. Tense your abdominal muscles. Tip your pelvis up toward the ceiling and flatten your lower back into the floor. ? To help with this exercise, you may place a small towel under your lower back and try to push your back into the towel. 3. Hold for __________ seconds. 4. Let your muscles relax completely before you repeat this exercise. Repeat __________ times. Complete this exercise __________ times a day. Exercise D: Alternating arm and leg raises  1. Get on your hands and knees on a firm surface. If you are on a hard floor, you may want to use padding to cushion your knees, such as an exercise mat. 2. Line up your arms and legs. Your hands should be below your shoulders, and your knees should be below your hips. 3. Lift your left leg behind you. At the same time, raise your right arm and straighten it in front of you. ? Do not lift your leg higher than your hip. ? Do not lift your arm higher than your shoulder. ? Keep your abdominal and back muscles tight. ? Keep your hips facing the ground. ? Do not arch your back. ? Keep your balance carefully, and do not hold your breath. 4. Hold for __________ seconds. 5. Slowly return to the starting position and repeat with your right leg and your left arm.  Repeat __________ times. Complete this exercise __________times a day. Exercise J: Single leg lower with bent knees 1. Lie on your back on a firm surface. 2. Tense your abdominal muscles and lift your feet off the floor, one foot at a time, so your knees and hips are bent in an "L" shape (at about 90 degrees). ? Your knees should be over your hips and your lower legs should be parallel to the floor. 3. Keeping your abdominal muscles tense and your knee bent, slowly lower one of your legs so your toe touches the ground. 4. Lift your leg back up to return  to the starting position. ? Do not hold your breath. ? Do not let your back arch. Keep your back flat against the ground. 5. Repeat with your other leg. Repeat __________ times. Complete this exercise __________ times a day. Posture and body mechanics  Body mechanics refers to the movements and positions of your body while you do your daily activities. Posture is part of body mechanics. Good posture and healthy body mechanics can help to relieve stress in your body's tissues and joints. Good posture means that your spine is in its natural S-curve position (your spine is neutral), your shoulders are pulled back slightly, and your head is not tipped forward. The following are general guidelines for applying improved posture and body mechanics to your everyday activities. Standing   When standing, keep your spine neutral and your feet about hip-width apart. Keep a slight bend in your knees. Your ears, shoulders, and hips should line up.  When you do a task in which you stand in one place for a long time, place one foot up on a stable object that is 2-4 inches (5-10 cm) high, such as a footstool. This helps keep your spine neutral. Sitting   When sitting, keep your spine neutral and keep your feet flat on the floor. Use a footrest, if necessary, and keep your thighs parallel to the floor. Avoid rounding your shoulders, and avoid tilting your head forward.  When working at a desk or a computer, keep your desk at a height where your hands are slightly lower than your elbows. Slide your chair under your desk so you are close enough to maintain good posture.  When working at a computer, place your monitor at a height where you are looking straight ahead and you do not have to tilt your head forward or downward to look at the screen. Resting   When lying down and resting, avoid positions that are most painful for you.  If you have pain with activities such as sitting, bending, stooping, or  squatting (flexion-based activities), lie in a position in which your body does not bend very much. For example, avoid curling up on your side with your arms and knees near your chest (fetal position).  If you have pain with activities such as standing for a long time or reaching with your arms (extension-based activities), lie with your spine in a neutral position and bend your knees slightly. Try the following positions: ? Lying on your side with a pillow between your knees. ? Lying on your back with a pillow under your knees. Lifting   When lifting objects, keep your feet at least shoulder-width apart and tighten your abdominal muscles.  Bend your knees and hips and keep your spine neutral. It is important to lift using the strength of your legs, not your back. Do not lock your knees straight out.  Always ask  for help to lift heavy or awkward objects. This information is not intended to replace advice given to you by your health care provider. Make sure you discuss any questions you have with your health care provider. Document Released: 07/03/2005 Document Revised: 03/09/2016 Document Reviewed: 04/14/2015 Elsevier Interactive Patient Education  2019 [employer].

## 2018-09-13 ENCOUNTER — Encounter: Payer: Self-pay | Admitting: Family Medicine

## 2018-09-13 NOTE — Telephone Encounter (Signed)
Would recommend we address this at f/u appt.  Lets have her return in 3-4 weeks for f/u of back pain and discuss this.

## 2018-09-20 ENCOUNTER — Telehealth: Payer: Self-pay | Admitting: Family Medicine

## 2018-09-20 DIAGNOSIS — M545 Low back pain, unspecified: Secondary | ICD-10-CM

## 2018-09-20 NOTE — Telephone Encounter (Signed)
Pt called to f/up on this request.  Please advise.   Copied from CRM [id number]. 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 She states that she went back to work and her back is hurting again. 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 It needs to state her situation on why she is not able to work (regarding the back pain and constant/repetitive leaning over someone with her back partially arched). Each person takes approx 2 hours of this repetitive motion. Please advise on writing pt a letter.

## 2018-09-23 NOTE — Telephone Encounter (Signed)
I can't write her out permanently, but I can write her out for a couple of weeks.  I would also recommend that we have her see physical therapy.  Please enter referral if ok with proceeding with PT.

## 2018-09-23 NOTE — Telephone Encounter (Signed)
Pt agreed to try PT and the referral has been placed. Informed pt letter would not be ready today as we are in clinic. Pt would like to pick up letter tomorrow morning.   Dr. Ashley Royalty, were you going to write letter or let me know what it needs to say

## 2018-09-24 ENCOUNTER — Encounter: Payer: Self-pay | Admitting: Family Medicine

## 2018-12-10 LAB — OB RESULTS CONSOLE RUBELLA ANTIBODY, IGM: Rubella: IMMUNE

## 2018-12-10 LAB — OB RESULTS CONSOLE ABO/RH: RH Type: POSITIVE

## 2018-12-10 LAB — OB RESULTS CONSOLE GC/CHLAMYDIA
Chlamydia: NEGATIVE
Gonorrhea: NEGATIVE

## 2018-12-10 LAB — OB RESULTS CONSOLE HEPATITIS B SURFACE ANTIGEN: Hepatitis B Surface Ag: NEGATIVE

## 2018-12-10 LAB — OB RESULTS CONSOLE HIV ANTIBODY (ROUTINE TESTING): HIV: NONREACTIVE

## 2018-12-10 LAB — OB RESULTS CONSOLE ANTIBODY SCREEN: Antibody Screen: NEGATIVE

## 2018-12-10 LAB — OB RESULTS CONSOLE RPR: RPR: NONREACTIVE

## 2019-02-05 ENCOUNTER — Telehealth: Payer: Self-pay | Admitting: Nurse Practitioner

## 2019-02-05 DIAGNOSIS — Z20822 Contact with and (suspected) exposure to covid-19: Secondary | ICD-10-CM

## 2019-02-05 NOTE — Progress Notes (Signed)
E-Visit for Corona Virus Screening   Your current symptoms could be consistent with the coronavirus.  Many health care providers can now test patients at their office but not all are.  [facility] has multiple testing sites. For information on our COVID testing locations and hours go to [email or web address]  Please quarantine yourself while awaiting your test results.  We are enrolling you in our MyChart Home Montioring for COVID19 . Daily you will receive a questionnaire within the MyChart website. Our COVID 19 response team willl be monitoriing your responses daily.    COVID-19 is a respiratory illness with symptoms that are similar to the flu. Symptoms are typically mild to moderate, but there have been cases of severe illness and death due to the virus. The following symptoms may appear 2-14 days after exposure: . Fever . Cough . Shortness of breath or difficulty breathing . Chills . Repeated shaking with chills . Muscle pain . Headache . Sore throat . New loss of taste or smell . Fatigue . Congestion or runny nose . Nausea or vomiting . Diarrhea  It is vitally important that if you feel that you have an infection such as this virus or any other virus that you stay home and away from places where you may spread it to others.  You should self-quarantine for 14 days if you have symptoms that could potentially be coronavirus or have been in close contact a with a person diagnosed with COVID-19 within the last 2 weeks. You should avoid contact with people age 65 and older.   You should wear a mask or cloth face covering over your nose and mouth if you must be around other people or animals, including pets (even at home). Try to stay at least 6 feet away from other people. This will protect the people around you.  You can use medication such as delsym OTC if develop a cough  You may also take acetaminophen (Tylenol) as needed for fever.   Reduce your risk of  any infection by using the same precautions used for avoiding the common cold or flu:  . Wash your hands often with soap and warm water for at least 20 seconds.  If soap and water are not readily available, use an alcohol-based hand sanitizer with at least 60% alcohol.  . If coughing or sneezing, cover your mouth and nose by coughing or sneezing into the elbow areas of your shirt or coat, into a tissue or into your sleeve (not your hands). . Avoid shaking hands with others and consider head nods or verbal greetings only. . Avoid touching your eyes, nose, or mouth with unwashed hands.  . Avoid close contact with people who are sick. . Avoid places or events with large numbers of people in one location, like concerts or sporting events. . Carefully consider travel plans you have or are making. . If you are planning any travel outside or inside the [place], visit the CDC's Travelers' Health webpage for the latest health notices. . If you have some symptoms but not all symptoms, continue to monitor at home and seek medical attention if your symptoms worsen. . If you are having a medical emergency, call 911.  HOME CARE . Only take medications as instructed by your medical team. . Drink plenty of fluids and get plenty of rest. . A steam or ultrasonic humidifier can help if you have congestion.   GET HELP RIGHT AWAY IF YOU HAVE EMERGENCY WARNING SIGNS** FOR COVID-19. If you or   someone is showing any of these signs seek emergency medical care immediately. Call 911 or proceed to your closest emergency facility if: . You develop worsening high fever. . Trouble breathing . Bluish lips or face . Persistent pain or pressure in the chest . New confusion . Inability to wake or stay awake . You cough up blood. . Your symptoms become more severe  **This list is not all possible symptoms. Contact your medical provider for any symptoms that are sever or concerning to you.   MAKE SURE YOU   Understand these  instructions.  Will watch your condition.  Will get help right away if you are not doing well or get worse.  Your e-visit answers were reviewed by a board certified advanced clinical practitioner to complete your personal care plan.  Depending on the condition, your plan could have included both over the counter or prescription medications.  If there is a problem please reply once you have received a response from your provider.  Your safety is important to us.  If you have drug allergies check your prescription carefully.    You can use MyChart to ask questions about today's visit, request a non-urgent call back, or ask for a work or school excuse for 24 hours related to this e-Visit. If it has been greater than 24 hours you will need to follow up with your provider, or enter a new e-Visit to address those concerns. You will get an e-mail in the next two days asking about your experience.  I hope that your e-visit has been valuable and will speed your recovery. Thank you for using e-visits.   5-10 minutes spent reviewing and documenting in chart.  

## 2019-04-23 ENCOUNTER — Telehealth: Payer: Self-pay

## 2019-04-23 NOTE — Telephone Encounter (Signed)

## 2019-04-24 ENCOUNTER — Encounter: Payer: Self-pay | Admitting: Family Medicine

## 2019-04-24 ENCOUNTER — Ambulatory Visit (INDEPENDENT_AMBULATORY_CARE_PROVIDER_SITE_OTHER): Payer: Medicaid Other | Admitting: Behavioral Health

## 2019-04-24 ENCOUNTER — Other Ambulatory Visit: Payer: Self-pay

## 2019-04-24 DIAGNOSIS — Z23 Encounter for immunization: Secondary | ICD-10-CM | POA: Diagnosis not present

## 2019-04-24 NOTE — Progress Notes (Signed)
Patient presents in clinic today for Influenza vaccination. IM injection was given in the left deltoid. Patient tolerated the injection well. No signs or symptoms of a reaction were noted prior to patient leaving the nurse visit. 

## 2019-05-06 ENCOUNTER — Encounter (HOSPITAL_COMMUNITY): Payer: Self-pay | Admitting: *Deleted

## 2019-05-06 ENCOUNTER — Inpatient Hospital Stay (HOSPITAL_COMMUNITY)
Admission: AD | Admit: 2019-05-06 | Discharge: 2019-05-06 | Disposition: A | Discharge status: Home / Self Care | Payer: Medicaid Other | Attending: Obstetrics and Gynecology | Admitting: Obstetrics and Gynecology

## 2019-05-06 ENCOUNTER — Other Ambulatory Visit: Payer: Self-pay

## 2019-05-06 DIAGNOSIS — O26893 Other specified pregnancy related conditions, third trimester: Secondary | ICD-10-CM | POA: Diagnosis not present

## 2019-05-06 DIAGNOSIS — Z3A29 29 weeks gestation of pregnancy: Secondary | ICD-10-CM | POA: Diagnosis not present

## 2019-05-06 DIAGNOSIS — R519 Headache, unspecified: Secondary | ICD-10-CM

## 2019-05-06 MED ORDER — METOCLOPRAMIDE HCL 10 MG PO TABS
10.0000 mg | ORAL_TABLET | Freq: Once | ORAL | Status: AC
  Administered 2019-05-06: 10 mg via ORAL
  Filled 2019-05-06: qty 1

## 2019-05-06 MED ORDER — CYCLOBENZAPRINE HCL 5 MG PO TABS: 5.0000 mg | ORAL_TABLET | Freq: Three times a day (TID) | ORAL | 0 refills | Status: DC | PRN

## 2019-05-06 MED ORDER — ACETAMINOPHEN 325 MG PO TABS
650.0000 mg | ORAL_TABLET | Freq: Once | ORAL | Status: AC
  Administered 2019-05-06: 650 mg via ORAL
  Filled 2019-05-06: qty 2

## 2019-05-06 NOTE — MAU Note (Signed)
called OB Gyn has been having headaches daily for over a wk.  Has taken Tylenol, not really helping.  When doing things doesn't have it, HA occurs when resting or quiet. No visual changes today, has seen floaters, denies epigastric pain or swelling.  Kind of feels like tension HA.

## 2019-05-06 NOTE — MAU Provider Note (Signed)
Chief Complaint:  Headache   First Provider Initiated Contact with Patient 05/06/19 1525     HPI: Diamond Smith is a 23 y.o. G2P1001 at [redacted]w[redacted]d who presents to maternity admissions reporting headache. Current headache has been intermittent for the last week. Feels like previous headaches. Reports frontal headache that she describes as aching & throbbing. Worse with lying down, sounds, and lights. Doesn't notice the headache when she is busy. Feels like the h/a is tension h/a that results from grinding her teeth. Normally treats with mouth guard & tylenol but that hasn't helped this week. Called her ob/gyn & was instructed to come here to have her BP checked. Pt denies hx of hypertension. No visual disturbance or epigastric pain. Denies n/v, fever/chills, thunder clap headache.  Denies contractions, leakage of fluid or vaginal bleeding. Good fetal movement.  Location: head Quality: aching, throbbing Severity: 4/10 in pain scale Duration: 1 week Timing: intermittent Modifying factors: worse with lights, sounds, & rest.  Associated signs and symptoms: none  Pregnancy Course: goes to [facility] ob/gyn for prenatal care  Past Medical History:  Diagnosis Date  . GERD (gastroesophageal reflux disease)    OB History  Gravida Para Term Preterm AB Living  2 1 1  0 0 1  SAB TAB Ectopic Multiple Live Births  0 0 0 0 1    # Outcome Date GA Lbr Len/2nd Weight Sex Delivery Anes PTL Lv  2 Current           1 Term 04/01/15 [redacted]w[redacted]d 14:13 / 01:59 2960 g M Vag-Spont EPI  LIV   History reviewed. No pertinent surgical history. Family History  Problem Relation Age of Onset  . Hypertension Mother   . Hyperlipidemia Mother    Social History   Tobacco Use  . Smoking status: Never Smoker  . Smokeless tobacco: Never Used  Substance Use Topics  . Alcohol use: No  . Drug use: Never   No Known Allergies No medications prior to admission.    I have reviewed patient's Past Medical Hx, Surgical Hx,  Family Hx, Social Hx, medications and allergies.   ROS:  Review of Systems  Constitutional: Negative.   HENT: Negative.   Eyes: Positive for photophobia. Negative for visual disturbance.  Gastrointestinal: Negative.   Genitourinary: Negative.   Neurological: Positive for headaches. Negative for dizziness and syncope.    Physical Exam   Patient Vitals for the past 24 hrs:  BP Temp Temp src Pulse Resp SpO2 Height Weight  05/06/19 1610 119/77 - - 91 - - - -  05/06/19 1608 119/77 - - 91 - - - -  05/06/19 1530 124/80 - - 99 - 97 % - -  05/06/19 1515 123/77 - - (!) 110 - - - -  05/06/19 1502 127/82 98.3 F (36.8 C) Oral (!) 116 17 100 % 5\' 1"  (1.549 m) 74.4 kg    Constitutional: Well-developed, well-nourished female in no acute distress.  Cardiovascular: normal rate & rhythm, no murmur Respiratory: normal effort, lung sounds clear throughout GI: Abd soft, non-tender, gravid appropriate for gestational age. Pos BS x 4 MS: Extremities nontender, no edema, normal ROM Neurologic: Alert and oriented x 4.   NST:  Baseline: 150 bpm, Variability: Good {> 6 bpm), Accelerations: Reactive and Decelerations: Absent   Labs: No results found for this or any previous visit (from the past 24 hour(s)).  Imaging:  No results found.  MAU Course: Orders Placed This Encounter  Procedures  . Discharge patient   Meds  ordered this encounter  Medications  . acetaminophen (TYLENOL) tablet 650 mg  . metoCLOPramide (REGLAN) tablet 10 mg  . cyclobenzaprine (FLEXERIL) 5 MG tablet    Sig: Take 1 tablet (5 mg total) by mouth 3 (three) times daily as needed (tension headache).    Dispense:  15 tablet    Refill:  0    Order Specific Question:   Supervising Provider    Answer:   Jaynie Collins A [[id number]]    MDM: Reactive NST Normotensive while in MAU Tylenol & reglan given in MAU. Patient did not want to stay to evaluate effectiveness as she has a Ros[facility] to go to this afternoon.   Assessment: 1.  Pregnancy headache in third trimester   2. [redacted] weeks gestation of pregnancy     Plan: Discharge home in stable condition.  Discussed reasons to return to MAU Rx flexeril Continue to take tylenol prn Keep f/u in office on Thursday  Follow-up Information    Ob/Gyn, [facility] Follow up.   Why: keep scheduled appointment or call office as needed Contact information: [address] [place] [address] [phone]        [facility] Follow up.   Specialty: Obstetrics and Gynecology Why: return for worsening symptoms Contact information: [address] [id number] mc [place] [address] [phone]          Allergies as of 05/06/2019   No Known Allergies     Medication List    STOP taking these medications   methylPREDNISolone 4 MG Tbpk tablet Commonly known as: Medrol     TAKE these medications   cyclobenzaprine 5 MG tablet Commonly known as: FLEXERIL Take 1 tablet (5 mg total) by mouth 3 (three) times daily as needed (tension headache).   multivitamin-prenatal 27-0.8 MG Tabs tablet Take 1 tablet by mouth daily at 12 noon.       Judeth Horn, NP 05/06/2019 8:02 PM

## 2019-05-06 NOTE — Discharge Instructions (Signed)
General Headache Without Cause A headache is pain or discomfort felt around the head or neck area. The specific cause of a headache may not be found. There are many causes and types of headaches. A few common ones are:  Tension headaches.  Migraine headaches.  Cluster headaches.  Chronic daily headaches. Follow these instructions at home: Watch your condition for any changes. Let your health care provider know about them. Take these steps to help with your condition: Managing pain      Take over-the-counter and prescription medicines only as told by your health care provider.  Lie down in a dark, quiet room when you have a headache.  If directed, put ice on your head and neck area: ? Put ice in a plastic bag. ? Place a towel between your skin and the bag. ? Leave the ice on for 20 minutes, 2-3 times per day.  If directed, apply heat to the affected area. Use the heat source that your health care provider recommends, such as a moist heat pack or a heating pad. ? Place a towel between your skin and the heat source. ? Leave the heat on for 20-30 minutes. ? Remove the heat if your skin turns bright red. This is especially important if you are unable to feel pain, heat, or cold. You may have a greater risk of getting burned.  Keep lights dim if bright lights bother you or make your headaches worse. Eating and drinking  Eat meals on a regular schedule.  If you drink alcohol: ? Limit how much you use to:  0-1 drink a day for women.  0-2 drinks a day for men. ? Be aware of how much alcohol is in your drink. In the [place] one drink equals one 12 oz bottle of beer (355 mL), one 5 oz glass of wine (148 mL), or one 1 oz glass of hard liquor (44 mL).  Stop drinking caffeine, or decrease the amount of caffeine you drink. General instructions   Keep a headache journal to help find out what may trigger your headaches. For example, write down: ? What you eat and drink. ? How much  sleep you get. ? Any change to your diet or medicines.  Try massage or other relaxation techniques.  Limit stress.  Sit up straight, and do not tense your muscles.  Do not use any products that contain nicotine or tobacco, such as cigarettes, e-cigarettes, and chewing tobacco. If you need help quitting, ask your health care provider.  Exercise regularly as told by your health care provider.  Sleep on a regular schedule. Get 7-9 hours of sleep each night, or the amount recommended by your health care provider.  Keep all follow-up visits as told by your health care provider. This is important. Contact a health care provider if:  Your symptoms are not helped by medicine.  You have a headache that is different from the usual headache.  You have nausea or you vomit.  You have a fever. Get help right away if:  Your headache becomes severe quickly.  Your headache gets worse after moderate to intense physical activity.  You have repeated vomiting.  You have a stiff neck.  You have a loss of vision.  You have problems with speech.  You have pain in the eye or ear.  You have muscular weakness or loss of muscle control.  You lose your balance or have trouble walking.  You feel faint or pass out.  You have confusion.    You have a seizure. Summary  A headache is pain or discomfort felt around the head or neck area.  There are many causes and types of headaches. In some cases, the cause may not be found.  Keep a headache journal to help find out what may trigger your headaches. Watch your condition for any changes. Let your health care provider know about them.  Contact a health care provider if you have a headache that is different from the usual headache, or if your symptoms are not helped by medicine.  Get help right away if your headache becomes severe, you vomit, you have a loss of vision, you lose your balance, or you have a seizure. This information is not  intended to replace advice given to you by your health care provider. Make sure you discuss any questions you have with your health care provider. Document Released: 07/03/2005 Document Revised: 01/21/2018 Document Reviewed: 01/21/2018 Elsevier Patient Education  2020 Elsevier Inc.  

## 2019-06-20 LAB — OB RESULTS CONSOLE GBS: GBS: NEGATIVE

## 2019-07-17 ENCOUNTER — Encounter (HOSPITAL_COMMUNITY): Payer: Self-pay | Admitting: *Deleted

## 2019-07-17 ENCOUNTER — Other Ambulatory Visit: Payer: Self-pay | Admitting: Obstetrics and Gynecology

## 2019-07-17 ENCOUNTER — Telehealth (HOSPITAL_COMMUNITY): Payer: Self-pay | Admitting: *Deleted

## 2019-07-17 NOTE — Telephone Encounter (Signed)
Preadmission screen  

## 2019-07-18 NOTE — L&D Delivery Note (Signed)
Patient was C/C/0 and pushed for approx 20 minutes with epidural.   Upon deliver of head gentle downward traction was insufficient, McRobert's and suprapubic pressure was performed, baby was LOA and posterior left arm was located and delivered facilitating delivery of body. Baby had poor tone. Cord was clamped and cut without delay and baby was transferred to warmer and began spontaneously crying in transit.  Baby was examined and both arms were moving well.  No obvious injury to arm or clavicle was noted.  Shoulder dystocia lasted 40 seconds and was explained to the parents and questions answered. NSVD  female infant, Apgars 6/9, weight pending.   The patient had left labial laceration repaired with 3-0 vicryl. Fundus was firm. EBL was expected amount. Placenta was delivered intact. Vagina was clear. Pecola Leisure was vigorous and  About toe be transferred for skin to skin with mother at my departure.  Philip Aspen

## 2019-07-21 ENCOUNTER — Other Ambulatory Visit (HOSPITAL_COMMUNITY)
Admission: RE | Admit: 2019-07-21 | Discharge: 2019-07-21 | Disposition: A | Discharge status: Home / Self Care | Payer: Medicaid Other | Source: Ambulatory Visit | Attending: Obstetrics and Gynecology | Admitting: Obstetrics and Gynecology

## 2019-07-21 DIAGNOSIS — Z20822 Contact with and (suspected) exposure to covid-19: Secondary | ICD-10-CM | POA: Insufficient documentation

## 2019-07-21 DIAGNOSIS — Z01812 Encounter for preprocedural laboratory examination: Secondary | ICD-10-CM | POA: Insufficient documentation

## 2019-07-21 LAB — SARS CORONAVIRUS 2 (TAT 6-24 HRS): SARS Coronavirus 2: NEGATIVE

## 2019-07-23 ENCOUNTER — Inpatient Hospital Stay (HOSPITAL_COMMUNITY)
Admission: AD | Admit: 2019-07-23 | Discharge: 2019-07-25 | DRG: 807 | Disposition: A | Discharge status: Home / Self Care | Payer: Medicaid Other | Attending: Obstetrics and Gynecology | Admitting: Obstetrics and Gynecology

## 2019-07-23 ENCOUNTER — Inpatient Hospital Stay (HOSPITAL_COMMUNITY): Payer: Medicaid Other

## 2019-07-23 ENCOUNTER — Other Ambulatory Visit: Payer: Self-pay

## 2019-07-23 ENCOUNTER — Inpatient Hospital Stay (HOSPITAL_COMMUNITY): Payer: Medicaid Other | Admitting: Anesthesiology

## 2019-07-23 ENCOUNTER — Encounter (HOSPITAL_COMMUNITY): Payer: Self-pay | Admitting: Obstetrics and Gynecology

## 2019-07-23 DIAGNOSIS — R030 Elevated blood-pressure reading, without diagnosis of hypertension: Secondary | ICD-10-CM | POA: Diagnosis not present

## 2019-07-23 DIAGNOSIS — Z3A41 41 weeks gestation of pregnancy: Secondary | ICD-10-CM

## 2019-07-23 DIAGNOSIS — O480 Post-term pregnancy: Secondary | ICD-10-CM | POA: Diagnosis present

## 2019-07-23 DIAGNOSIS — O9089 Other complications of the puerperium, not elsewhere classified: Secondary | ICD-10-CM | POA: Diagnosis not present

## 2019-07-23 LAB — CBC
HCT: 38.6 % (ref 36.0–46.0)
Hemoglobin: 12.5 g/dL (ref 12.0–15.0)
MCH: 28.6 pg (ref 26.0–34.0)
MCHC: 32.4 g/dL (ref 30.0–36.0)
MCV: 88.3 fL (ref 80.0–100.0)
Platelets: 203 10*3/uL (ref 150–400)
RBC: 4.37 MIL/uL (ref 3.87–5.11)
RDW: 14 % (ref 11.5–15.5)
WBC: 7.7 10*3/uL (ref 4.0–10.5)
nRBC: 0 % (ref 0.0–0.2)

## 2019-07-23 LAB — TYPE AND SCREEN
ABO/RH(D): A POS
Antibody Screen: NEGATIVE

## 2019-07-23 LAB — ABO/RH: ABO/RH(D): A POS

## 2019-07-23 MED ORDER — PHENYLEPHRINE 40 MCG/ML (10ML) SYRINGE FOR IV PUSH (FOR BLOOD PRESSURE SUPPORT): 80.0000 ug | PREFILLED_SYRINGE | INTRAVENOUS | Status: DC | PRN

## 2019-07-23 MED ORDER — LACTATED RINGERS IV SOLN: 500.0000 mL | INTRAVENOUS | Status: DC | PRN

## 2019-07-23 MED ORDER — ZOLPIDEM TARTRATE 5 MG PO TABS: 5.0000 mg | ORAL_TABLET | Freq: Every evening | ORAL | Status: DC | PRN

## 2019-07-23 MED ORDER — ACETAMINOPHEN 325 MG PO TABS: 650.0000 mg | ORAL_TABLET | ORAL | Status: DC | PRN

## 2019-07-23 MED ORDER — BENZOCAINE-MENTHOL 20-0.5 % EX AERO
1.0000 "application " | INHALATION_SPRAY | CUTANEOUS | Status: DC | PRN
  Administered 2019-07-23: 1 via TOPICAL
  Filled 2019-07-23: qty 56

## 2019-07-23 MED ORDER — LACTATED RINGERS IV SOLN: 500.0000 mL | Freq: Once | INTRAVENOUS | Status: DC

## 2019-07-23 MED ORDER — EPHEDRINE 5 MG/ML INJ: 10.0000 mg | INTRAVENOUS | Status: DC | PRN

## 2019-07-23 MED ORDER — SOD CITRATE-CITRIC ACID 500-334 MG/5ML PO SOLN: 30.0000 mL | ORAL | Status: DC | PRN

## 2019-07-23 MED ORDER — ONDANSETRON HCL 4 MG/2ML IJ SOLN: 4.0000 mg | INTRAMUSCULAR | Status: DC | PRN

## 2019-07-23 MED ORDER — SODIUM CHLORIDE (PF) 0.9 % IJ SOLN
INTRAMUSCULAR | Status: DC | PRN
  Administered 2019-07-23: 12 mL/h via EPIDURAL

## 2019-07-23 MED ORDER — ONDANSETRON HCL 4 MG/2ML IJ SOLN: 4.0000 mg | Freq: Four times a day (QID) | INTRAMUSCULAR | Status: DC | PRN

## 2019-07-23 MED ORDER — OXYTOCIN 40 UNITS IN NORMAL SALINE INFUSION - SIMPLE MED
1.0000 m[IU]/min | INTRAVENOUS | Status: DC
  Administered 2019-07-23: 4 m[IU]/min via INTRAVENOUS
  Filled 2019-07-23: qty 1000

## 2019-07-23 MED ORDER — ONDANSETRON HCL 4 MG PO TABS: 4.0000 mg | ORAL_TABLET | ORAL | Status: DC | PRN

## 2019-07-23 MED ORDER — FENTANYL-BUPIVACAINE-NACL 0.5-0.125-0.9 MG/250ML-% EP SOLN: 12.0000 mL/h | EPIDURAL | Status: DC | PRN

## 2019-07-23 MED ORDER — LIDOCAINE HCL (PF) 1 % IJ SOLN
30.0000 mL | INTRAMUSCULAR | Status: AC | PRN
  Administered 2019-07-23: 11 mL via SUBCUTANEOUS

## 2019-07-23 MED ORDER — PRENATAL MULTIVITAMIN CH
1.0000 | ORAL_TABLET | Freq: Every day | ORAL | Status: DC
  Administered 2019-07-24: 1 via ORAL
  Filled 2019-07-23: qty 1

## 2019-07-23 MED ORDER — IBUPROFEN 600 MG PO TABS
600.0000 mg | ORAL_TABLET | Freq: Four times a day (QID) | ORAL | Status: DC
  Administered 2019-07-23 – 2019-07-25 (×6): 600 mg via ORAL
  Filled 2019-07-23 (×6): qty 1

## 2019-07-23 MED ORDER — ACETAMINOPHEN 325 MG PO TABS
650.0000 mg | ORAL_TABLET | ORAL | Status: DC | PRN
  Filled 2019-07-23: qty 2

## 2019-07-23 MED ORDER — OXYTOCIN BOLUS FROM INFUSION
500.0000 mL | Freq: Once | INTRAVENOUS | Status: AC
  Administered 2019-07-23: 500 mL via INTRAVENOUS

## 2019-07-23 MED ORDER — SENNOSIDES-DOCUSATE SODIUM 8.6-50 MG PO TABS
2.0000 | ORAL_TABLET | ORAL | Status: DC
  Administered 2019-07-23 – 2019-07-25 (×2): 2 via ORAL
  Filled 2019-07-23 (×2): qty 2

## 2019-07-23 MED ORDER — FENTANYL CITRATE (PF) 100 MCG/2ML IJ SOLN
50.0000 ug | INTRAMUSCULAR | Status: DC | PRN
  Administered 2019-07-23 (×2): 50 ug via INTRAVENOUS
  Filled 2019-07-23 (×2): qty 2

## 2019-07-23 MED ORDER — OXYTOCIN 40 UNITS IN NORMAL SALINE INFUSION - SIMPLE MED: 1.0000 m[IU]/min | INTRAVENOUS | Status: DC

## 2019-07-23 MED ORDER — SIMETHICONE 80 MG PO CHEW: 80.0000 mg | CHEWABLE_TABLET | ORAL | Status: DC | PRN

## 2019-07-23 MED ORDER — LACTATED RINGERS IV SOLN: INTRAVENOUS | Status: DC

## 2019-07-23 MED ORDER — TERBUTALINE SULFATE 1 MG/ML IJ SOLN: 0.2500 mg | Freq: Once | INTRAMUSCULAR | Status: DC | PRN

## 2019-07-23 MED ORDER — FENTANYL-BUPIVACAINE-NACL 0.5-0.125-0.9 MG/250ML-% EP SOLN
EPIDURAL | Status: AC
  Filled 2019-07-23: qty 250

## 2019-07-23 MED ORDER — DIPHENHYDRAMINE HCL 25 MG PO CAPS: 25.0000 mg | ORAL_CAPSULE | Freq: Four times a day (QID) | ORAL | Status: DC | PRN

## 2019-07-23 MED ORDER — BUSPIRONE HCL 15 MG PO TABS
7.5000 mg | ORAL_TABLET | Freq: Two times a day (BID) | ORAL | Status: DC
  Administered 2019-07-23 – 2019-07-25 (×4): 7.5 mg via ORAL
  Filled 2019-07-23 (×4): qty 1

## 2019-07-23 MED ORDER — TETANUS-DIPHTH-ACELL PERTUSSIS 5-2.5-18.5 LF-MCG/0.5 IM SUSP: 0.5000 mL | Freq: Once | INTRAMUSCULAR | Status: DC

## 2019-07-23 MED ORDER — OXYCODONE-ACETAMINOPHEN 5-325 MG PO TABS: 2.0000 | ORAL_TABLET | ORAL | Status: DC | PRN

## 2019-07-23 MED ORDER — WITCH HAZEL-GLYCERIN EX PADS
1.0000 "application " | MEDICATED_PAD | CUTANEOUS | Status: DC | PRN
  Administered 2019-07-23: 1 via TOPICAL

## 2019-07-23 MED ORDER — COCONUT OIL OIL
1.0000 "application " | TOPICAL_OIL | Status: DC | PRN
  Administered 2019-07-25: 1 via TOPICAL

## 2019-07-23 MED ORDER — DIBUCAINE (PERIANAL) 1 % EX OINT: 1.0000 "application " | TOPICAL_OINTMENT | CUTANEOUS | Status: DC | PRN

## 2019-07-23 MED ORDER — OXYCODONE-ACETAMINOPHEN 5-325 MG PO TABS: 1.0000 | ORAL_TABLET | ORAL | Status: DC | PRN

## 2019-07-23 MED ORDER — DIPHENHYDRAMINE HCL 50 MG/ML IJ SOLN: 12.5000 mg | INTRAMUSCULAR | Status: DC | PRN

## 2019-07-23 MED ORDER — OXYTOCIN 40 UNITS IN NORMAL SALINE INFUSION - SIMPLE MED: 2.5000 [IU]/h | INTRAVENOUS | Status: DC

## 2019-07-23 NOTE — Anesthesia Procedure Notes (Signed)
Epidural Patient location during procedure: OB Start time: 07/23/2019 4:24 PM End time: 07/23/2019 4:44 PM  Staffing Anesthesiologist: Lowella Curb, MD Performed: anesthesiologist   Preanesthetic Checklist Completed: patient identified, IV checked, site marked, risks and benefits discussed, surgical consent, monitors and equipment checked, pre-op evaluation and timeout performed  Epidural Patient position: sitting Prep: ChloraPrep Patient monitoring: heart rate, cardiac monitor, continuous pulse ox and blood pressure Approach: midline Injection technique: LOR air  Needle:  Needle type: Tuohy  Needle gauge: 17 G Needle length: 9 cm Needle insertion depth: 6 cm Catheter type: closed end flexible Catheter size: 20 Guage Catheter at skin depth: 10 cm Test dose: negative  Assessment Events: blood not aspirated, injection not painful, no injection resistance, no paresthesia and negative IV test  Additional Notes Reason for block:procedure for pain

## 2019-07-23 NOTE — Anesthesia Preprocedure Evaluation (Signed)
Anesthesia Evaluation  Patient identified by MRN, date of birth, ID band Patient awake    Reviewed: Allergy & Precautions, NPO status , Patient's Chart, lab work & pertinent test results  History of Anesthesia Complications Negative for: history of anesthetic complications  Airway Mallampati: II  TM Distance: >3 FB Neck ROM: Full    Dental no notable dental hx. (+) Dental Advisory Given   Pulmonary neg pulmonary ROS,    Pulmonary exam normal breath sounds clear to auscultation       Cardiovascular hypertension, Normal cardiovascular exam Rhythm:Regular Rate:Normal     Neuro/Psych negative neurological ROS  negative psych ROS   GI/Hepatic negative GI ROS, Neg liver ROS,   Endo/Other  negative endocrine ROS  Renal/GU negative Renal ROS  negative genitourinary   Musculoskeletal negative musculoskeletal ROS (+)   Abdominal   Peds negative pediatric ROS (+)  Hematology negative hematology ROS (+)   Anesthesia Other Findings   Reproductive/Obstetrics (+) Pregnancy                             Anesthesia Physical  Anesthesia Plan  ASA: II  Anesthesia Plan: Epidural   Post-op Pain Management:    Induction:   PONV Risk Score and Plan:   Airway Management Planned:   Additional Equipment:   Intra-op Plan:   Post-operative Plan:   Informed Consent: I have reviewed the patients History and Physical, chart, labs and discussed the procedure including the risks, benefits and alternatives for the proposed anesthesia with the patient or authorized representative who has indicated his/her understanding and acceptance.     Dental advisory given  Plan Discussed with: CRNA  Anesthesia Plan Comments:         Anesthesia Quick Evaluation

## 2019-07-23 NOTE — H&P (Addendum)
24 y.o. [redacted]w[redacted]d  G2P1001 comes in for scheduled IOL of past due date.  Otherwise has good fetal movement and no bleeding.  Past Medical History:  Diagnosis Date  . GERD (gastroesophageal reflux disease)    History reviewed. No pertinent surgical history.  OB History  Gravida Para Term Preterm AB Living  2 1 1  0 0 1  SAB TAB Ectopic Multiple Live Births  0 0 0 0 1    # Outcome Date GA Lbr Len/2nd Weight Sex Delivery Anes PTL Lv  2 Current           1 Term 04/01/15 [redacted]w[redacted]d 14:13 / 01:59 2960 g M Vag-Spont EPI  LIV    Social History   Socioeconomic History  . Marital status: Married    Spouse name: Not on file  . Number of children: Not on file  . Years of education: Not on file  . Highest education level: Not on file  Occupational History  . Not on file  Tobacco Use  . Smoking status: Never Smoker  . Smokeless tobacco: Never Used  Substance and Sexual Activity  . Alcohol use: No  . Drug use: Never  . Sexual activity: Yes  Other Topics Concern  . Not on file  Social History Narrative  . Not on file   Social Determinants of Health   Financial Resource Strain:   . Difficulty of Paying Living Expenses: Not on file  Food Insecurity:   . Worried About [redacted]w[redacted]d in the Last Year: Not on file  . Ran Out of Food in the Last Year: Not on file  Transportation Needs:   . Lack of Transportation (Medical): Not on file  . Lack of Transportation (Non-Medical): Not on file  Physical Activity:   . Days of Exercise per Week: Not on file  . Minutes of Exercise per Session: Not on file  Stress:   . Feeling of Stress : Not on file  Social Connections:   . Frequency of Communication with Friends and Family: Not on file  . Frequency of Social Gatherings with Friends and Family: Not on file  . Attends Religious Services: Not on file  . Active Member of Clubs or Organizations: Not on file  . Attends Programme researcher, broadcasting/film/video Meetings: Not on file  . Marital Status: Not on file   Intimate Partner Violence:   . Fear of Current or Ex-Partner: Not on file  . Emotionally Abused: Not on file  . Physically Abused: Not on file  . Sexually Abused: Not on file   Patient has no known allergies.    Prenatal Transfer Tool  Maternal Diabetes: No Genetic Screening: Normal Maternal Ultrasounds/Referrals: Normal Fetal Ultrasounds or other Referrals:  None Maternal Substance Abuse:  No Significant Maternal Medications:  None Significant Maternal Lab Results: Group B Strep negative  Other PNC: uncomplicated.  H.o GHTN prior pregnancy.  Deliver 6#8 baby at 40.4    There were no vitals filed for this visit.  Lungs/Cor:  NAD Abdomen:  soft, gravid Ex:  no cords, erythema SVE:  3/60 in office, pending L&D eval FHTs: 140 mod var, no current decels or accels Toco:  q3-4   A/P   Admit for IOL for pd at 41.0  GBS Neg  Anticipate pitocin 2x2 and AROM  Epidural if desired  Other routine care  4/60

## 2019-07-24 LAB — CBC
HCT: 32 % — ABNORMAL LOW (ref 36.0–46.0)
Hemoglobin: 10.6 g/dL — ABNORMAL LOW (ref 12.0–15.0)
MCH: 29.4 pg (ref 26.0–34.0)
MCHC: 33.1 g/dL (ref 30.0–36.0)
MCV: 88.9 fL (ref 80.0–100.0)
Platelets: 191 10*3/uL (ref 150–400)
RBC: 3.6 MIL/uL — ABNORMAL LOW (ref 3.87–5.11)
RDW: 14.5 % (ref 11.5–15.5)
WBC: 9.5 10*3/uL (ref 4.0–10.5)
nRBC: 0 % (ref 0.0–0.2)

## 2019-07-24 NOTE — Lactation Note (Signed)
This note was copied from a baby's chart. Lactation Consultation Note:  P2, Infant is 34 hours old. Mother reports that she breastfed her first child for 2 yrs.  Mother concerned that infant is not getting what he needs. She reports that he is beginning to breastfeed frequently.   Mother in laid back position with infant placed STS in cradle hold.  Infant was observed latching on. Observed suckling and a frequent swallows.  Infant released the breast after 10 mins. Observed that mothers nipple was compressed at the bottom.  Discussed infants need to get entire nipple shaft and some of the areola when latching. Discussed tugging on infants chin and rolling top lip upward for wide open mouth.  Suggestions were made to rotate positions and use good pillow support in the early days . Discussed that cluster feeding is normal the second night of life.  Reviewed hand expression with mother and mother reports that she see colostrum drops.   Mother to continue to cue base feed and feed infant 8-12 or more times in 24 hours. Encouraged STS frequently.  Mother was given [facility] brochure and informed of available LC services at  [facility].  Patient Name: Diamond Smith [id number] Date: 07/24/2019 Reason for consult: Initial assessment   Maternal Data Has patient been taught Hand Expression?: Yes Does the patient have breastfeeding experience prior to this delivery?: Yes  Feeding Feeding Type: Breast Fed  LATCH Score Latch: Grasps breast easily, tongue down, lips flanged, rhythmical sucking.  Audible Swallowing: Spontaneous and intermittent  Type of Nipple: Everted at rest and after stimulation  Comfort (Breast/Nipple): Soft / non-tender  Hold (Positioning): No assistance needed to correctly position infant at breast.  LATCH Score: 10  Interventions Interventions: Breast feeding basics reviewed;Skin to skin;Hand express;Breast compression;Adjust position;Support pillows;Position options;Expressed  milk  Lactation Tools Discussed/Used     Consult Status Consult Status: Follow-up Date: 07/25/19 Follow-up type: In-patient    Stevan Born [facility] 07/24/2019, 2:53 PM

## 2019-07-24 NOTE — Progress Notes (Signed)
Patient is doing well.  She is ambulating, voiding, tolerating PO.  Pain control is good.  Lochia is appropriate  Vitals:   07/23/19 2026 07/23/19 2149 07/24/19 0150 07/24/19 0556  BP: 120/82 116/68 117/81 114/65  Pulse: (!) 102 (!) 109 89 88  Resp: 18 20 18 19   Temp:  98.6 F (37 C) 98.8 F (37.1 C) 98.9 F (37.2 C)  TempSrc:  Oral Oral Oral  SpO2: 98% 98% 100% 100%  Weight:      Height:        NAD Fundus firm Ext: trace edema bilaterally   Lab Results  Component Value Date   WBC 9.5 07/24/2019   HGB 10.6 (L) 07/24/2019   HCT 32.0 (L) 07/24/2019   MCV 88.9 07/24/2019   PLT 191 07/24/2019    --/--/A POS, A POS Performed at [facility], [address], [place], [place]  (01/06 0847)/RImmune  A/P 24 y.o. G2P2002 PPD#1 s/p TSVD. Routine care.   May desire 24 hours discharge.    [facility]

## 2019-07-24 NOTE — Anesthesia Postprocedure Evaluation (Signed)
Anesthesia Post Note  Patient: Diamond Smith  Procedure(s) Performed: AN AD HOC LABOR EPIDURAL     Patient location during evaluation: Mother Baby Anesthesia Type: Epidural Level of consciousness: awake Pain management: satisfactory to patient Vital Signs Assessment: post-procedure vital signs reviewed and stable Respiratory status: spontaneous breathing Cardiovascular status: stable Anesthetic complications: no    Last Vitals:  Vitals:   07/24/19 0150 07/24/19 0556  BP: 117/81 114/65  Pulse: 89 88  Resp: 18 19  Temp: 37.1 C 37.2 C  SpO2: 100% 100%    Last Pain:  Vitals:   07/24/19 0556  TempSrc: Oral  PainSc: 0-No pain   Pain Goal:                   [employer]

## 2019-07-25 MED ORDER — IBUPROFEN 600 MG PO TABS: 600.0000 mg | ORAL_TABLET | Freq: Four times a day (QID) | ORAL | 1 refills | Status: DC | PRN

## 2019-07-25 MED ORDER — FERROUS SULFATE 325 (65 FE) MG PO TABS: 325.0000 mg | ORAL_TABLET | Freq: Every day | ORAL | 11 refills | Status: DC

## 2019-07-25 MED ORDER — ACETAMINOPHEN 325 MG PO TABS: 650.0000 mg | ORAL_TABLET | ORAL | 1 refills | Status: DC | PRN

## 2019-07-25 MED ORDER — SENNOSIDES-DOCUSATE SODIUM 8.6-50 MG PO TABS: 2.0000 | ORAL_TABLET | ORAL | 1 refills | Status: DC

## 2019-07-25 NOTE — Progress Notes (Signed)
Post Partum Day 2 Subjective: no complaints, up ad lib, voiding and tolerating PO  Objective: Patient Vitals for the past 24 hrs:  BP Temp Temp src Pulse Resp SpO2  07/25/19 0954 129/86 -- -- 80 -- --  07/25/19 0524 (!) 134/97 99.2 F (37.3 C) Oral 75 18 --  07/24/19 2134 129/83 98.9 F (37.2 C) Oral 82 17 100 %  07/24/19 1529 122/80 98.8 F (37.1 C) Oral 82 16 99 %  07/24/19 1020 108/70 99 F (37.2 C) Oral 93 18 99 %   Physical Exam:  General: alert and no distress Lochia: appropriate Uterine Fundus: firm DVT Evaluation: No evidence of DVT seen on physical exam.  Recent Labs    07/23/19 0849 07/24/19 0631  WBC 7.7 9.5  HGB 12.5 10.6*  HCT 38.6 32.0*  PLT 203 191   Assessment/Plan:  Philipp Deputy 24 y.o. G2P2002 PPD#2 sp TSVD at [redacted]w[redacted]d, doing well DC today 1. PPC: left lac repaired, EBL 395cc 2. BP elevated this AM, recheck normotensive. Rubella immune, blood type A+, breastfeeding, baby boy with mom (no circ)  LOS: 2 days   Starkeisha Vanwinkle K Taam-Akelman 07/25/2019, 10:08 AM

## 2019-07-25 NOTE — Discharge Instructions (Signed)

## 2019-07-25 NOTE — Discharge Summary (Signed)
OB Discharge Summary     Patient Name: Diamond Smith DOB: 1996/04/03 MRN: [id number]  Date of admission: 07/23/2019 Delivering MD: Philip Aspen   Date of discharge: 07/25/2019  Admitting diagnosis: Post-dates pregnancy [O48.0] Intrauterine pregnancy: [redacted]w[redacted]d     Secondary diagnosis:  Active Problems:   Post-dates pregnancy   SVD (spontaneous vaginal delivery)  Additional problems: None     Discharge diagnosis: Term Pregnancy Delivered                                                                                                Post partum procedures:none  Augmentation: AROM and Pitocin  Complications: None  Hospital course:  Induction of Labor With Vaginal Delivery   24 y.o. yo G2P2002 at [redacted]w[redacted]d was admitted to the hospital 07/23/2019 for induction of labor.  Indication for induction: Postdates.  Patient had an uncomplicated labor course as follows: On admit 3cm, received pitocin and AROM. Progressed along a normal labor curve.  Membrane Rupture Time/Date: 3:38 PM ,07/23/2019   Intrapartum Procedures: Episiotomy: None [1]                                         Lacerations:  Labial [10];1st degree [2]  Patient had delivery of a Viable infant.  Information for the patient's newborn:  Glorya, Bartley [[id number]]  Delivery Method: Vaginal, Spontaneous(Filed from Delivery Summary)    07/23/2019  Details of delivery can be found in separate delivery note. Of note, 40 second shoulder dystocia relieved with delivery of posterior arm.  Patient had a routine postpartum course. Blood pressure were normotensive throughout hospital stay except 1 elevated day of discharge, repeat normal. Patient is discharged home 07/25/19.  Physical exam  Vitals:   07/24/19 1529 07/24/19 2134 07/25/19 0524 07/25/19 0954  BP: 122/80 129/83 (!) 134/97 129/86  Pulse: 82 82 75 80  Resp: 16 17 18    Temp: 98.8 F (37.1 C) 98.9 F (37.2 C) 99.2 F (37.3 C)   TempSrc: Oral Oral Oral   SpO2: 99% 100%     Weight:      Height:       General: alert and no distress Lochia: appropriate Uterine Fundus: firm DVT Evaluation: No evidence of DVT seen on physical exam.  Labs: Lab Results  Component Value Date   WBC 9.5 07/24/2019   HGB 10.6 (L) 07/24/2019   HCT 32.0 (L) 07/24/2019   MCV 88.9 07/24/2019   PLT 191 07/24/2019   CMP Latest Ref Rng & Units 03/30/2015  Glucose 65 - 99 mg/dL 74  BUN 6 - 20 mg/dL 10  Creatinine 04/01/2015 - 0.86 mg/dL 5.78  Sodium 4.69 - 629 mmol/L 132(L)  Potassium 3.5 - 5.1 mmol/L 4.0  Chloride 101 - 111 mmol/L 100(L)  CO2 22 - 32 mmol/L 21(L)  Calcium 8.9 - 10.3 mg/dL 9.4  Total Protein 6.5 - 8.1 g/dL 6.7  Total Bilirubin 0.3 - 1.2 mg/dL 528)  Alkaline Phos 38 - 126 U/L 279(H)  AST 15 - 41  U/L 30  ALT 14 - 54 U/L 26    Discharge instruction: per After Visit Summary and "Baby and Me Booklet".  After visit meds:  Allergies as of 07/25/2019   No Known Allergies     Medication List    TAKE these medications   acetaminophen 325 MG tablet Commonly known as: Tylenol Take 2 tablets (650 mg total) by mouth every 4 (four) hours as needed (for pain scale < 4).   busPIRone 7.5 MG tablet Commonly known as: BUSPAR Take 7.5 mg by mouth 2 (two) times daily.   cyclobenzaprine 5 MG tablet Commonly known as: FLEXERIL Take 1 tablet (5 mg total) by mouth 3 (three) times daily as needed (tension headache).   ferrous sulfate 325 (65 FE) MG tablet Commonly known as: FerrouSul Take 1 tablet (325 mg total) by mouth daily with breakfast.   ibuprofen 600 MG tablet Commonly known as: ADVIL Take 1 tablet (600 mg total) by mouth every 6 (six) hours as needed.   multivitamin-prenatal 27-0.8 MG Tabs tablet Take 1 tablet by mouth daily at 12 noon.   senna-docusate 8.6-50 MG tablet Commonly known as: Senokot-S Take 2 tablets by mouth daily. Start taking on: July 26, 2019       Diet: routine diet  Activity: Advance as tolerated. Pelvic rest for 6 weeks.    Outpatient follow up:4 weeks Follow up Appt:No future appointments. Follow up Visit:No follow-ups on file.  Postpartum contraception: considering LNG IUD postpartum   Newborn Data: Live born female  Birth Weight: 7 lb 14.1 oz (3575 g) APGAR: 6, 9  Newborn Delivery   Birth date/time: 07/23/2019 18:35:00 Delivery type: Vaginal, Spontaneous      Baby Feeding: Breast Disposition:home with mother   07/25/2019 Jonelle Sidle, MD

## 2019-09-16 ENCOUNTER — Other Ambulatory Visit: Payer: Self-pay

## 2019-10-30 ENCOUNTER — Encounter: Payer: Self-pay | Admitting: Family Medicine

## 2020-02-24 IMAGING — CT CT RENAL STONE PROTOCOL
2 of 4 series · 16 of 46 positions shown, 18 images · non-contrast
Comparison: None.

CLINICAL DATA: Intermittent right lower quadrant pain with urinary
frequency. Concern for nephrolithiasis.

EXAM:
CT ABDOMEN AND PELVIS WITHOUT CONTRAST
TECHNIQUE: Multidetector CT imaging of the abdomen and pelvis was performed
following the standard protocol without IV contrast.

[Series 2: axial st · axial · 0.76mm/px · z∈[+487,+907]mm · 13 of 92 slices shown, 15 images]
[im 4/92  soft-tissue]
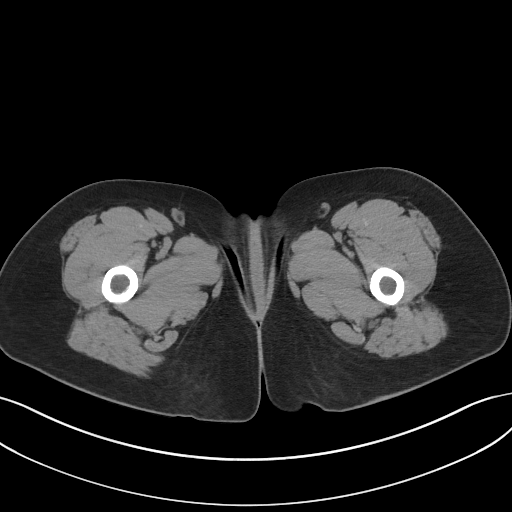
[im 4/92  bone]
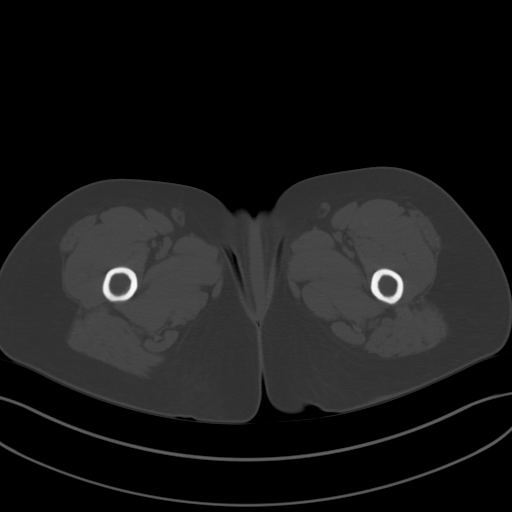
[im 11/92  soft-tissue]
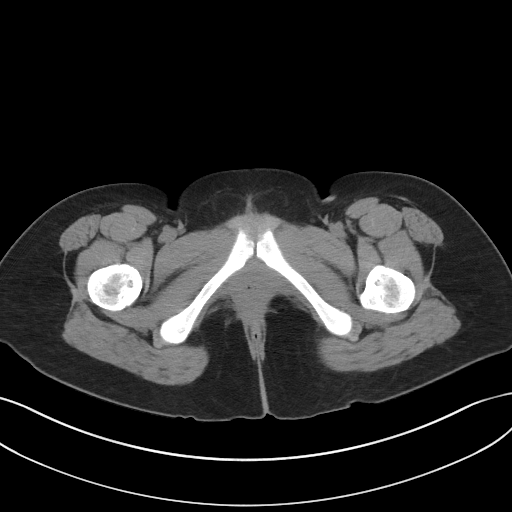
[im 19/92  soft-tissue]
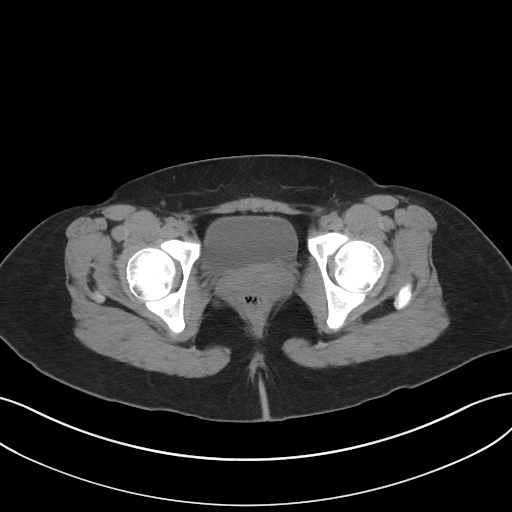
[im 26/92  soft-tissue]
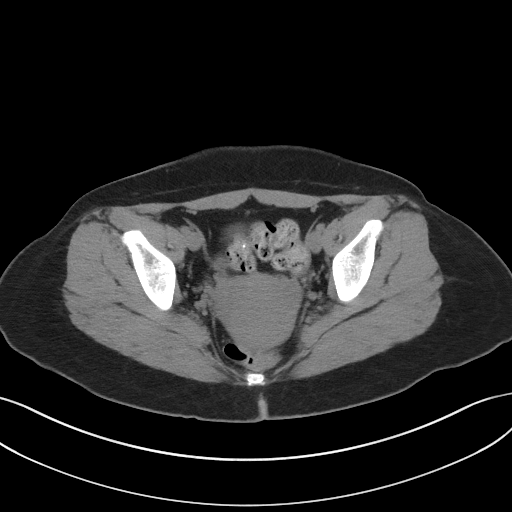
[im 33/92  soft-tissue]
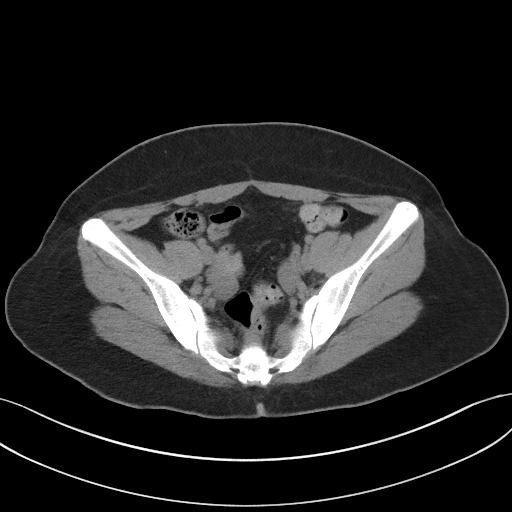
[im 41/92  soft-tissue]
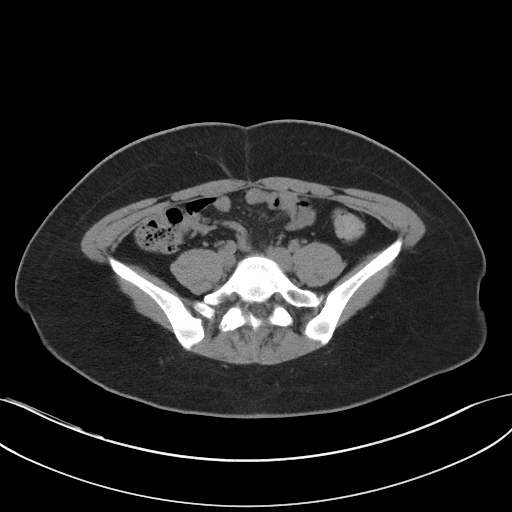
[im 48/92  soft-tissue]
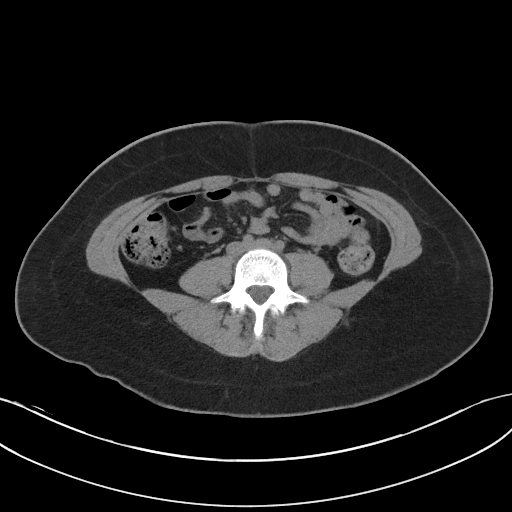
[im 51/92  soft-tissue]
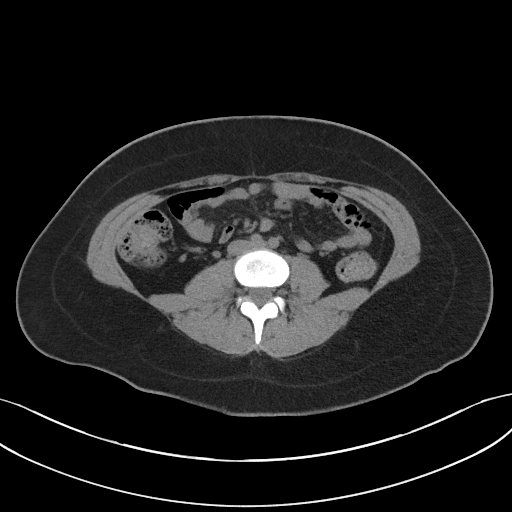
[im 59/92  soft-tissue]
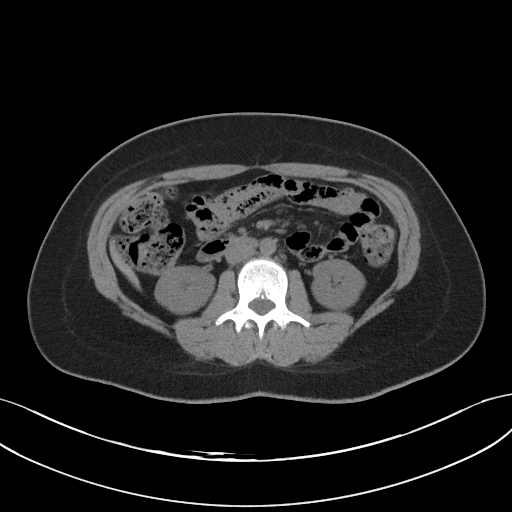
[im 59/92  bone]
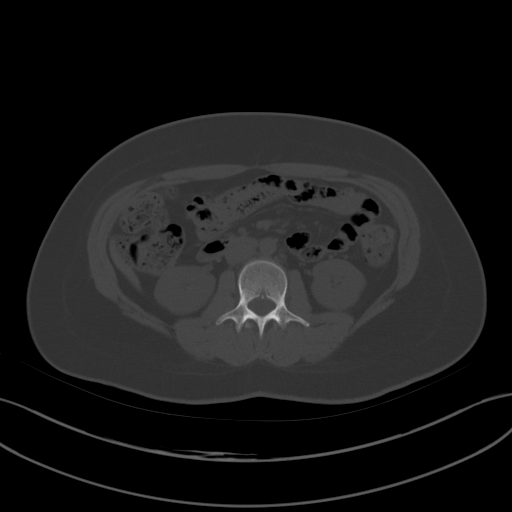
[im 66/92  soft-tissue]
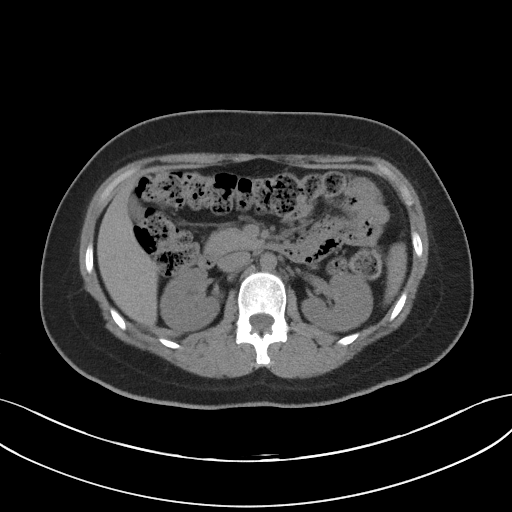
[im 73/92  soft-tissue]
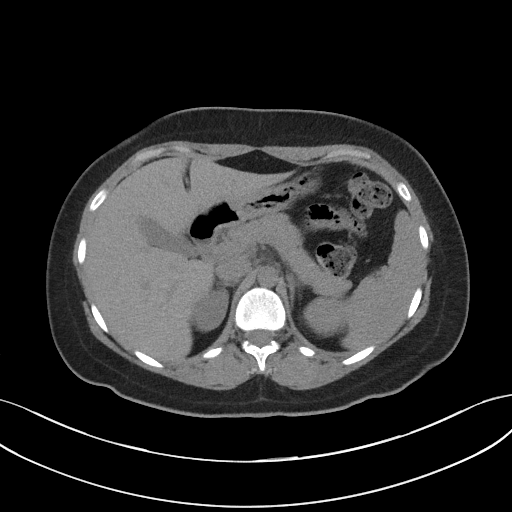
[im 81/92  soft-tissue]
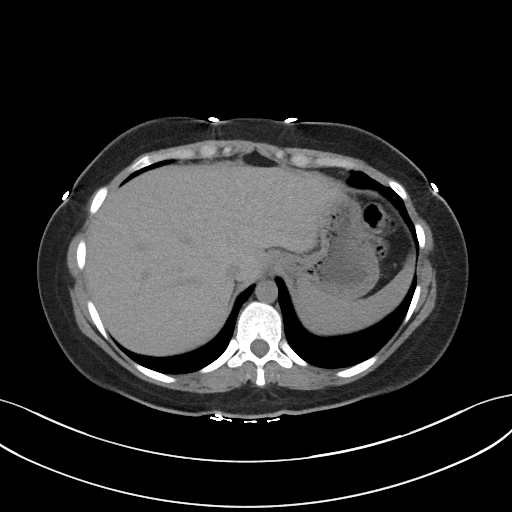
[im 88/92  soft-tissue]
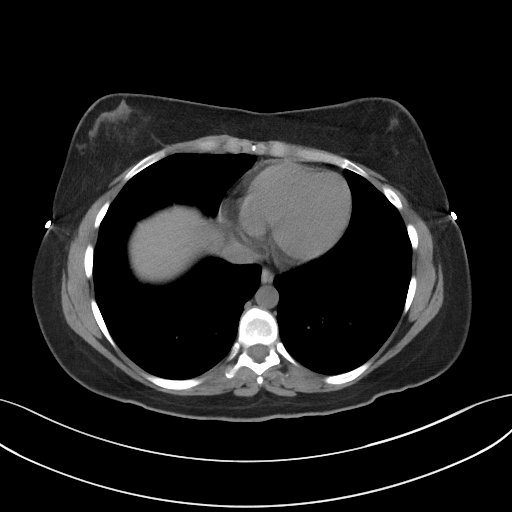

[Series 5: coronal st · coronal · 0.79mm/px · 3 of 101 slices shown]
[im 34/101  soft-tissue]
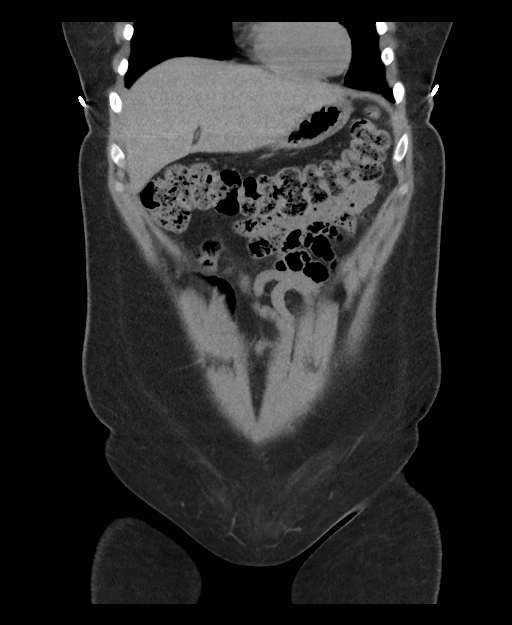
[im 45/101  soft-tissue]
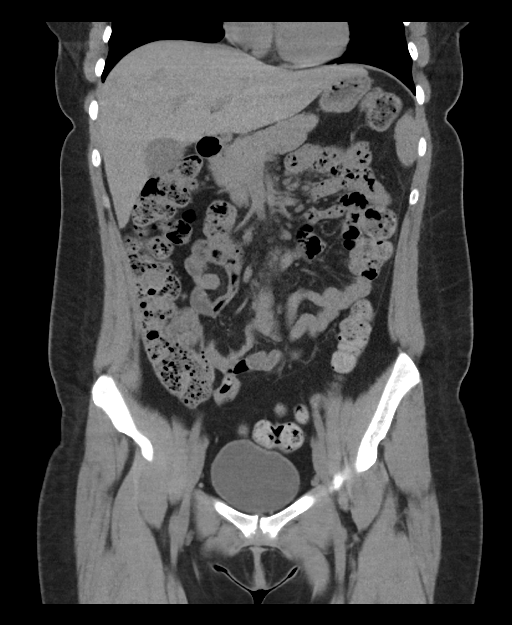
[im 56/101  soft-tissue]
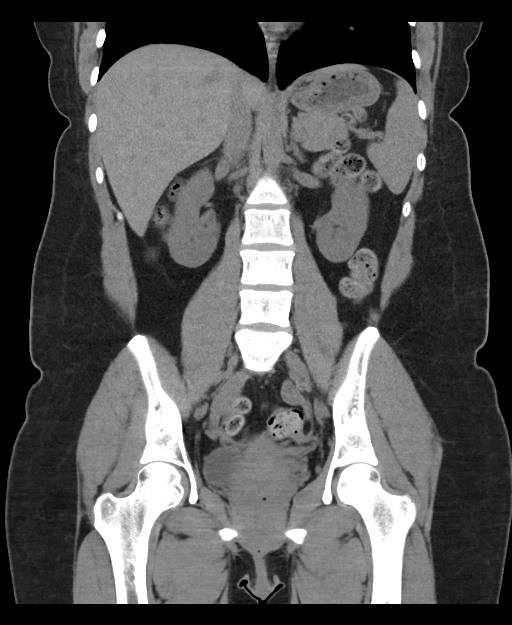

[16 of 46 positions shown; findings below may reference images not displayed]

FINDINGS: Lower chest: No acute abnormality.

Hepatobiliary: No focal liver abnormality is seen. No gallstones,
gallbladder wall thickening, or biliary dilatation.

Pancreas: Unremarkable. No pancreatic ductal dilatation or
surrounding inflammatory changes.

Spleen: Normal in size without focal abnormality.

Adrenals/Urinary Tract: Adrenal glands are unremarkable. Kidneys are
normal, without renal calculi, focal lesion, or hydronephrosis.
Bladder is unremarkable.

Stomach/Bowel: Stomach is within normal limits. Appendix appears
normal. No evidence of bowel wall thickening, distention, or
inflammatory changes.

Vascular/Lymphatic: Limited assessment of the vasculature without
contrast. No aneurysm. No retroperitoneal hemorrhage or hematoma. No
bulky adenopathy.

Reproductive: Uterus normal in size and retroverted. No adnexal
abnormality. No pelvic free fluid.

Other: No abdominal wall hernia or abnormality. No abdominopelvic
ascites.

Musculoskeletal: No acute or significant osseous finding.
IMPRESSION: No acute obstructing urinary tract calculus, hydronephrosis, or
obstructive uropathy.

No other acute intra-abdominopelvic finding by noncontrast CT.

## 2020-03-08 LAB — RPR: RPR Ser Ql: NONREACTIVE — AB

## 2020-09-03 ENCOUNTER — Ambulatory Visit: Payer: Medicaid Other | Admitting: Nurse Practitioner

## 2020-09-06 ENCOUNTER — Telehealth: Payer: Self-pay

## 2020-09-06 NOTE — Telephone Encounter (Signed)
Patient aware of message below and will wait until upcoming appointment.

## 2020-09-06 NOTE — Telephone Encounter (Signed)
Patient calling for a referral to dermatologist for face breaking out sometimes. Former patient of Dr. Ashley Royalty last OV 09/12/18 upcoming appointment with Dr. Veto Kemps 10/12/20 okay for referral or does patient need to wait for appointment? Please advise.

## 2020-09-06 NOTE — Telephone Encounter (Signed)
Pt calling to ask about a referral to a dermatologlist, [facility].  Face breaks out from time to time.  Pt is breast feeding and wanted to know what she can use.  She is a former pt of Dr. Ashley Royalty and has a NP appointment w/Dr. Veto Kemps in March.  Pt explained that she was told that she can just call here for referral to be placed.  Pt would like a call back to let her know.  I sent message to provider of the day nurse.  Please advise [phone]

## 2020-10-12 ENCOUNTER — Ambulatory Visit (INDEPENDENT_AMBULATORY_CARE_PROVIDER_SITE_OTHER): Payer: Medicaid Other | Admitting: Family Medicine

## 2020-10-12 ENCOUNTER — Encounter: Payer: Self-pay | Admitting: Family Medicine

## 2020-10-12 ENCOUNTER — Other Ambulatory Visit: Payer: Self-pay

## 2020-10-12 VITALS — BP 118/76 | HR 91 | Temp 97.9°F | Ht 62.5 in | Wt 161.4 lb

## 2020-10-12 DIAGNOSIS — Z887 Allergy status to serum and vaccine status: Secondary | ICD-10-CM | POA: Diagnosis not present

## 2020-10-12 DIAGNOSIS — R252 Cramp and spasm: Secondary | ICD-10-CM

## 2020-10-12 DIAGNOSIS — L700 Acne vulgaris: Secondary | ICD-10-CM | POA: Diagnosis not present

## 2020-10-12 DIAGNOSIS — Z862 Personal history of diseases of the blood and blood-forming organs and certain disorders involving the immune mechanism: Secondary | ICD-10-CM | POA: Diagnosis not present

## 2020-10-12 LAB — CBC
HCT: 43.6 % (ref 36.0–46.0)
Hemoglobin: 14.6 g/dL (ref 12.0–15.0)
MCHC: 33.4 g/dL (ref 30.0–36.0)
MCV: 88 fl (ref 78.0–100.0)
Platelets: 293 10*3/uL (ref 150.0–400.0)
RBC: 4.96 Mil/uL (ref 3.87–5.11)
RDW: 12.9 % (ref 11.5–15.5)
WBC: 6.7 10*3/uL (ref 4.0–10.5)

## 2020-10-12 LAB — COMPREHENSIVE METABOLIC PANEL
ALT: 22 U/L (ref 0–35)
AST: 21 U/L (ref 0–37)
Albumin: 4.6 g/dL (ref 3.5–5.2)
Alkaline Phosphatase: 74 U/L (ref 39–117)
BUN: 15 mg/dL (ref 6–23)
CO2: 26 mEq/L (ref 19–32)
Calcium: 9.1 mg/dL (ref 8.4–10.5)
Chloride: 102 mEq/L (ref 96–112)
Creatinine, Ser: 0.58 mg/dL (ref 0.40–1.20)
GFR: 126.22 mL/min (ref >60.00)
Glucose, Bld: 89 mg/dL (ref 70–99)
Potassium: 4.7 mEq/L (ref 3.5–5.1)
Sodium: 136 mEq/L (ref 135–145)
Total Bilirubin: 0.8 mg/dL (ref 0.2–1.2)
Total Protein: 7.2 g/dL (ref 6.0–8.3)

## 2020-10-12 LAB — TSH: TSH: 2.52 u[IU]/mL (ref 0.35–4.50)

## 2020-10-12 NOTE — Progress Notes (Signed)
[facility] [address] [place] [address] Dept: [phone] Dept Fax: [phone]  Transfer of Care Office Visit  Subjective:    Patient ID: Diamond Smith, female    DOB: 1995-08-22, 25 y.o..   MRN: [id number]  Chief Complaint  Patient presents with  . Establish Care    NP- CPE/labs. C/o legs feeling achy off/on x 6 months.  Wants a referral to dermatology for acne.     History of Present Illness:  Patient is in today to establish care. [name] is currently married and has two children. Her last child was born in Jan. 2021. She had an uncomplicated pregnancy, except for some mild anemia. She is currently breastfeeding her son.  [name] notes that for the past 6 months, she has had a sensation of lack of energy or a generalized weakness. She also seems to notice this more in her lower legs bilaterally. She describes a sensation of a "fever" in her body, but notes her temperatures are always normal. Att times she notes pain in her legs when walking on a cold floor. She wonders about her calcium level.  [name] also notes an issue with facial acne that has been present for years. She notes there are some deeper tender nodules present. She uses some OTC facial washes, but has not noted consistent improvement.  [name] notes that she had hives and itching within a day of receiving the Pfizer COVID-19 vaccine. She has had the initial 2 shot series. She wonders about receiving further booster doses.  Past Medical History: Patient Active Problem List   Diagnosis Date Noted  . Low back pain 09/12/2018  . Low grade squamous intraepith lesion on cytologic smear cervix (lgsil) 06/21/2018  . Gastroesophageal reflux disease without esophagitis 09/14/2017  . Gestational hypertension 03/30/2015   No past surgical history on file.  Family History  Problem Relation Age of Onset  . Hypertension Mother    Outpatient Medications  Prior to Visit  Medication Sig Dispense Refill  . levonorgestrel (MIRENA, 52 MG,) 20 MCG/24HR IUD Mirena 20 mcg/24 hours (6 yrs) 52 mg intrauterine device  Take 1 device by intrauterine route.    Marland Kitchen acetaminophen (TYLENOL) 325 MG tablet Take 2 tablets (650 mg total) by mouth every 4 (four) hours as needed (for pain scale < 4). (Patient not taking: Reported on 10/12/2020) 30 tablet 1  . busPIRone (BUSPAR) 7.5 MG tablet Take 7.5 mg by mouth 2 (two) times daily. (Patient not taking: Reported on 10/12/2020)    . cyclobenzaprine (FLEXERIL) 5 MG tablet Take 1 tablet (5 mg total) by mouth 3 (three) times daily as needed (tension headache). (Patient not taking: Reported on 10/12/2020) 15 tablet 0  . ferrous sulfate (FERROUSUL) 325 (65 FE) MG tablet Take 1 tablet (325 mg total) by mouth daily with breakfast. (Patient not taking: Reported on 10/12/2020) 30 tablet 11  . ibuprofen (ADVIL) 600 MG tablet Take 1 tablet (600 mg total) by mouth every 6 (six) hours as needed. (Patient not taking: Reported on 10/12/2020) 30 tablet 1  . Prenatal Vit-Fe Fumarate-FA (MULTIVITAMIN-PRENATAL) 27-0.8 MG TABS tablet Take 1 tablet by mouth daily at 12 noon. (Patient not taking: Reported on 10/12/2020)    . senna-docusate (SENOKOT-S) 8.6-50 MG tablet Take 2 tablets by mouth daily. (Patient not taking: Reported on 10/12/2020) 30 tablet 1   No facility-administered medications prior to visit.   No Known Allergies    Objective:   Today's Vitals  10/12/20 0839  BP: 118/76  Pulse: 91  Temp: 97.9 F (36.6 C)  TempSrc: Temporal  SpO2: 99%  Weight: 161 lb 6.4 oz (73.2 kg)  Height: 5' 2.5" (1.588 m)   Body mass index is 29.05 kg/m.   General: Well developed, well nourished. No acute distress. Skin: Warm and dry. Ont he face, there are multiple closed comedones, papules, and scattered pustules with a few palpable deeper   cystic lesions present. Extremities: No edema of the lower legs. No tenderness to palpation. FROM.   Psych: Alert and oriented. Normal mood and affect.  Health Maintenance Due  Topic Date Due  . Hepatitis C Screening  Never done  . HPV VACCINES (1 - 2-dose series) Never done  . CHLAMYDIA SCREENING  Never done  . PAP-Cervical Cytology Screening  Never done  . PAP SMEAR-Modifier  Never done  . INFLUENZA VACCINE  02/15/2020  . COVID-19 Vaccine (3 - Booster for Pfizer series) 04/21/2020      Assessment & Plan:   1. Leg cramps I will screen for electrolyte or mineral imbalance. I will also assess for possible thyroid disease. If these are normal. we discussed that she may need ot do more stretching. She has recently started to engage in physical activity through a gym. We discussed the benefits of working with a trainer to optimize benefits and reduce risk for injury.  - TSH - Comprehensive metabolic panel  2. History of hypochromic microcytic anemia I will recheck a CBC in light of anemia during pregnancy.  - CBC  3. Acne vulgaris Primarily papulopustular acne with a few cysts. I recommend referral to dermatology in light of her breastfeeding status to determine her best regimen for this.  - Ambulatory referral to Dermatology  4. 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 should not receive further COVID-10 mRNA vaccines. She would be eligible to take the [employer] vaccine for her booster.  Loyola Mast, MD

## 2020-10-12 NOTE — Patient Instructions (Signed)
Mediterranean Diet A Mediterranean diet refers to food and lifestyle choices that are based on the traditions of countries located on the [place] Sea. This way of eating has been shown to help prevent certain conditions and improve outcomes for people who have chronic diseases, like kidney disease and heart disease. What are tips for following this plan? Lifestyle  Cook and eat meals together with your family, when possible.  Drink enough fluid to keep your urine clear or pale yellow.  Be physically active every day. This includes: ? Aerobic exercise like running or swimming. ? Leisure activities like gardening, walking, or housework.  Get 7-8 hours of sleep each night.  If recommended by your health care provider, drink red wine in moderation. This means 1 glass a day for nonpregnant women and 2 glasses a day for men. A glass of wine equals 5 oz (150 mL). Reading food labels  Check the serving size of packaged foods. For foods such as rice and pasta, the serving size refers to the amount of cooked product, not dry.  Check the total fat in packaged foods. Avoid foods that have saturated fat or trans fats.  Check the ingredients list for added sugars, such as corn syrup.   Shopping  At the grocery store, buy most of your food from the areas near the walls of the store. This includes: ? Fresh fruits and vegetables (produce). ? Grains, beans, nuts, and seeds. Some of these may be available in unpackaged forms or large amounts (in bulk). ? Fresh seafood. ? Poultry and eggs. ? Low-fat dairy products.  Buy whole ingredients instead of prepackaged foods.  Buy fresh fruits and vegetables in-season from local farmers markets.  Buy frozen fruits and vegetables in resealable bags.  If you do not have access to quality fresh seafood, buy precooked frozen shrimp or canned fish, such as tuna, salmon, or sardines.  Buy small amounts of raw or cooked vegetables, salads, or olives from  the deli or salad bar at your store.  Stock your pantry so you always have certain foods on hand, such as olive oil, canned tuna, canned tomatoes, rice, pasta, and beans. Cooking  Cook foods with extra-virgin olive oil instead of using butter or other vegetable oils.  Have meat as a side dish, and have vegetables or grains as your main dish. This means having meat in small portions or adding small amounts of meat to foods like pasta or stew.  Use beans or vegetables instead of meat in common dishes like chili or lasagna.  Experiment with different cooking methods. Try roasting or broiling vegetables instead of steaming or sauteing them.  Add frozen vegetables to soups, stews, pasta, or rice.  Add nuts or seeds for added healthy fat at each meal. You can add these to yogurt, salads, or vegetable dishes.  Marinate fish or vegetables using olive oil, lemon juice, garlic, and fresh herbs. Meal planning  Plan to eat 1 vegetarian meal one day each week. Try to work up to 2 vegetarian meals, if possible.  Eat seafood 2 or more times a week.  Have healthy snacks readily available, such as: ? Vegetable sticks with hummus. ? Greek yogurt. ? Fruit and nut trail mix.  Eat balanced meals throughout the week. This includes: ? Fruit: 2-3 servings a day ? Vegetables: 4-5 servings a day ? Low-fat dairy: 2 servings a day ? Fish, poultry, or lean meat: 1 serving a day ? Beans and legumes: 2 or more servings a week ?   Nuts and seeds: 1-2 servings a day ? Whole grains: 6-8 servings a day ? Extra-virgin olive oil: 3-4 servings a day  Limit red meat and sweets to only a few servings a month   What are my food choices?  Mediterranean diet ? Recommended  Grains: Whole-grain pasta. Brown rice. Bulgar wheat. Polenta. Couscous. Whole-wheat bread. Oatmeal. Quinoa.  Vegetables: Artichokes. Beets. Broccoli. Cabbage. Carrots. Eggplant. Green beans. Chard. Kale. Spinach. Onions. Leeks. Peas. Squash.  Tomatoes. Peppers. Radishes.  Fruits: Apples. Apricots. Avocado. Berries. Bananas. Cherries. Dates. Figs. Grapes. Lemons. Melon. Oranges. Peaches. Plums. Pomegranate.  Meats and other protein foods: Beans. Almonds. Sunflower seeds. Pine nuts. Peanuts. Cod. Salmon. Scallops. Shrimp. Tuna. Tilapia. Clams. Oysters. Eggs.  Dairy: Low-fat milk. Cheese. Greek yogurt.  Beverages: Water. Red wine. Herbal tea.  Fats and oils: Extra virgin olive oil. Avocado oil. Grape seed oil.  Sweets and desserts: Greek yogurt with honey. Baked apples. Poached pears. Trail mix.  Seasoning and other foods: Basil. Cilantro. Coriander. Cumin. Mint. Parsley. Sage. Rosemary. Tarragon. Garlic. Oregano. Thyme. Pepper. Balsalmic vinegar. Tahini. Hummus. Tomato sauce. Olives. Mushrooms. ? Limit these  Grains: Prepackaged pasta or rice dishes. Prepackaged cereal with added sugar.  Vegetables: Deep fried potatoes (french fries).  Fruits: Fruit canned in syrup.  Meats and other protein foods: Beef. Pork. Lamb. Poultry with skin. Hot dogs. Bacon.  Dairy: Ice cream. Sour cream. Whole milk.  Beverages: Juice. Sugar-sweetened soft drinks. Beer. Liquor and spirits.  Fats and oils: Butter. Canola oil. Vegetable oil. Beef fat (tallow). Lard.  Sweets and desserts: Cookies. Cakes. Pies. Candy.  Seasoning and other foods: Mayonnaise. Premade sauces and marinades. The items listed may not be a complete list. Talk with your dietitian about what dietary choices are right for you. Summary  The Mediterranean diet includes both food and lifestyle choices.  Eat a variety of fresh fruits and vegetables, beans, nuts, seeds, and whole grains.  Limit the amount of red meat and sweets that you eat.  Talk with your health care provider about whether it is safe for you to drink red wine in moderation. This means 1 glass a day for nonpregnant women and 2 glasses a day for men. A glass of wine equals 5 oz (150 mL). This information  is not intended to replace advice given to you by your health care provider. Make sure you discuss any questions you have with your health care provider. Document Revised: 03/02/2016 Document Reviewed: 02/24/2016 Elsevier Patient Education  2020 Elsevier Inc.  

## 2020-10-13 ENCOUNTER — Telehealth: Payer: Self-pay | Admitting: Family Medicine

## 2020-10-13 NOTE — Telephone Encounter (Signed)
I faxed over via [facility]

## 2020-10-13 NOTE — Telephone Encounter (Signed)
REFERRAL REQUEST Telephone Note  What type of referral do you need? Pt  was given a referral on 10/12/20 to [facility] and they do not accept [facility] [employer].   Have you been seen at our office for this problem? She was seen yesterday 10/12/20 by Dr. Veto Kemps for this issue.    Is there a particular doctor or location pt would prefer?  Pt is wanting a referral to [facility] [place].  Patient notified that referrals can take up to a week or longer to process. If they haven't heard anything within a week they should call back and speak with the referral department.

## 2020-11-01 DIAGNOSIS — L700 Acne vulgaris: Secondary | ICD-10-CM | POA: Diagnosis not present

## 2021-01-05 DIAGNOSIS — L700 Acne vulgaris: Secondary | ICD-10-CM | POA: Diagnosis not present

## 2021-01-28 DIAGNOSIS — R309 Painful micturition, unspecified: Secondary | ICD-10-CM | POA: Diagnosis not present

## 2021-01-28 DIAGNOSIS — M545 Low back pain, unspecified: Secondary | ICD-10-CM | POA: Diagnosis not present

## 2021-03-14 DIAGNOSIS — J3089 Other allergic rhinitis: Secondary | ICD-10-CM | POA: Diagnosis not present

## 2021-03-14 DIAGNOSIS — L501 Idiopathic urticaria: Secondary | ICD-10-CM | POA: Diagnosis not present

## 2021-03-28 ENCOUNTER — Encounter: Payer: Self-pay | Admitting: Family Medicine

## 2021-03-28 DIAGNOSIS — L501 Idiopathic urticaria: Secondary | ICD-10-CM | POA: Insufficient documentation

## 2021-03-28 DIAGNOSIS — J309 Allergic rhinitis, unspecified: Secondary | ICD-10-CM | POA: Insufficient documentation

## 2021-04-07 DIAGNOSIS — L700 Acne vulgaris: Secondary | ICD-10-CM | POA: Diagnosis not present

## 2021-05-19 ENCOUNTER — Other Ambulatory Visit: Payer: Self-pay

## 2021-05-19 ENCOUNTER — Encounter: Payer: Self-pay | Admitting: Family Medicine

## 2021-05-19 ENCOUNTER — Ambulatory Visit (INDEPENDENT_AMBULATORY_CARE_PROVIDER_SITE_OTHER): Payer: Medicaid Other

## 2021-05-19 DIAGNOSIS — Z23 Encounter for immunization: Secondary | ICD-10-CM | POA: Diagnosis not present

## 2021-05-19 NOTE — Progress Notes (Signed)
Per orders of Dr. Veto Kemps, Pt is here for influenza vaccine, pt received injection IM in the left deltoid, given by Dominic Pea, CMA. Pt tolerated injection well. Pt was notified to report any adverse reactions to me immediately.

## 2021-05-25 ENCOUNTER — Telehealth: Payer: Self-pay | Admitting: Family Medicine

## 2021-05-25 DIAGNOSIS — L501 Idiopathic urticaria: Secondary | ICD-10-CM

## 2021-05-26 NOTE — Telephone Encounter (Signed)
Patient notified VIA phone. No further questions. Dm/cma  

## 2021-06-13 DIAGNOSIS — L501 Idiopathic urticaria: Secondary | ICD-10-CM | POA: Diagnosis not present

## 2021-06-13 DIAGNOSIS — J3089 Other allergic rhinitis: Secondary | ICD-10-CM | POA: Diagnosis not present

## 2021-07-25 ENCOUNTER — Ambulatory Visit: Payer: Medicaid Other | Admitting: Allergy

## 2021-09-08 ENCOUNTER — Ambulatory Visit: Payer: Medicaid Other | Admitting: Nurse Practitioner

## 2021-09-08 ENCOUNTER — Other Ambulatory Visit: Payer: Self-pay

## 2021-09-08 ENCOUNTER — Encounter: Payer: Self-pay | Admitting: Nurse Practitioner

## 2021-09-08 VITALS — BP 114/69 | HR 98 | Temp 97.8°F | Wt 166.4 lb

## 2021-09-08 DIAGNOSIS — R202 Paresthesia of skin: Secondary | ICD-10-CM

## 2021-09-08 DIAGNOSIS — R200 Anesthesia of skin: Secondary | ICD-10-CM | POA: Diagnosis not present

## 2021-09-08 NOTE — Progress Notes (Signed)
Acute Office Visit  Subjective:    Patient ID: Diamond Smith, female    DOB: 1996-02-28, 26 y.o.   MRN: [id number]  Chief Complaint  Patient presents with   Numbness    Pt c/o left arm and leg falling asleep w/ numbness x2 days    HPI Patient is in today for feeling numbness and tingling in her left arm and leg for 2 days. It got slightly better yesterday and today it is intermittent. Also endorses some tingling around her lips last night like she had novocain wearing off. The numbness and tingling is only to her left forearm and left lower portion of leg. Notices it more when she is sitting down. She is right handed, but uses both hands at work. She has also changed her diet and has been limiting her sugar intake, going to the gym and started a creatin supplement. Denies chest pain, shortness of breath, dizziness, and weakness.   Past Medical History:  Diagnosis Date   GERD (gastroesophageal reflux disease)     History reviewed. No pertinent surgical history.  Family History  Problem Relation Age of Onset   Hypertension Mother     Social History   Socioeconomic History   Marital status: Married    Spouse name: Not on file   Number of children: Not on file   Years of education: Not on file   Highest education level: Not on file  Occupational History   Not on file  Tobacco Use   Smoking status: Never   Smokeless tobacco: Never  Vaping Use   Vaping Use: Never used  Substance and Sexual Activity   Alcohol use: No   Drug use: Never   Sexual activity: Yes  Other Topics Concern   Not on file  Social History Narrative   Not on file   Social Determinants of Health   Financial Resource Strain: Not on file  Food Insecurity: Not on file  Transportation Needs: Not on file  Physical Activity: Not on file  Stress: Not on file  Social Connections: Not on file  Intimate Partner Violence: Not on file    Outpatient Medications Prior to Visit  Medication Sig Dispense  Refill   levonorgestrel (MIRENA, 52 MG,) 20 MCG/24HR IUD Mirena 20 mcg/24 hours (6 yrs) 52 mg intrauterine device  Take 1 device by intrauterine route.     No facility-administered medications prior to visit.    Allergies  Allergen Reactions   Covid-19 (Mrna) Vaccine Hives    Review of Systems See pertinent positives and negatives per HPI.    Objective:    Physical Exam Vitals and nursing note reviewed.  Constitutional:      General: She is not in acute distress.    Appearance: Normal appearance.  HENT:     Head: Normocephalic.  Eyes:     Conjunctiva/sclera: Conjunctivae normal.  Cardiovascular:     Rate and Rhythm: Normal rate and regular rhythm.     Pulses: Normal pulses.     Heart sounds: Normal heart sounds.  Pulmonary:     Effort: Pulmonary effort is normal.     Breath sounds: Normal breath sounds.  Musculoskeletal:        General: No swelling or tenderness. Normal range of motion.     Cervical back: Normal range of motion.     Comments: Negative straight leg raise, does notice some tingling with phalen's test  Skin:    General: Skin is warm.  Neurological:  General: No focal deficit present.     Mental Status: She is alert and oriented to person, place, and time.     Cranial Nerves: No cranial nerve deficit.     Motor: No weakness.     Coordination: Coordination normal.  Psychiatric:        Mood and Affect: Mood normal.        Behavior: Behavior normal.        Thought Content: Thought content normal.        Judgment: Judgment normal.    BP 114/69    Pulse 98    Temp 97.8 F (36.6 C) (Temporal)    Wt 166 lb 6.4 oz (75.5 kg)    SpO2 98%    BMI 29.95 kg/m  Wt Readings from Last 3 Encounters:  09/08/21 166 lb 6.4 oz (75.5 kg)  10/12/20 161 lb 6.4 oz (73.2 kg)  07/23/19 183 lb (83 kg)    Health Maintenance Due  Topic Date Due   HPV VACCINES (1 - 2-dose series) Never done   Hepatitis C Screening  Never done   PAP-Cervical Cytology Screening  Never  done   PAP SMEAR-Modifier  Never done   COVID-19 Vaccine (3 - Pfizer risk series) 11/18/2019       Topic Date Due   HPV VACCINES (1 - 2-dose series) Never done     Lab Results  Component Value Date   TSH 2.52 10/12/2020   Lab Results  Component Value Date   WBC 6.7 10/12/2020   HGB 14.6 10/12/2020   HCT 43.6 10/12/2020   MCV 88.0 10/12/2020   PLT 293.0 10/12/2020   Lab Results  Component Value Date   NA 136 10/12/2020   K 4.7 10/12/2020   CO2 26 10/12/2020   GLUCOSE 89 10/12/2020   BUN 15 10/12/2020   CREATININE 0.58 10/12/2020   BILITOT 0.8 10/12/2020   ALKPHOS 74 10/12/2020   AST 21 10/12/2020   ALT 22 10/12/2020   PROT 7.2 10/12/2020   ALBUMIN 4.6 10/12/2020   CALCIUM 9.1 10/12/2020   ANIONGAP 11 03/30/2015   GFR 126.22 10/12/2020   No results found for: CHOL No results found for: HDL No results found for: LDLCALC No results found for: TRIG No results found for: CHOLHDL No results found for: JOIT2P     Assessment & Plan:   Problem List Items Addressed This Visit       Other   Numbness and tingling - Primary    Symptoms started 2 days ago and are improving, it is intermittent in nature today. No red flags on exam. Will check CMP, CBC, A1C and vitamin B12. Follow up if symptoms worsen or don't improve.       Relevant Orders   CBC with Differential/Platelet   Comprehensive metabolic panel   Hemoglobin A1c   Vitamin B12    No orders of the defined types were placed in this encounter.   Gerre Scull, NP

## 2021-09-08 NOTE — Patient Instructions (Signed)
It was great to see you!  We are checking labs and will let you know the results.   Let's follow-up if your symptoms worsen or don't improve.   Take care,  Vance Peper, NP

## 2021-09-08 NOTE — Assessment & Plan Note (Signed)
Symptoms started 2 days ago and are improving, it is intermittent in nature today. No red flags on exam. Will check CMP, CBC, A1C and vitamin B12. Follow up if symptoms worsen or don't improve.

## 2021-09-09 ENCOUNTER — Ambulatory Visit: Payer: Medicaid Other | Admitting: Family Medicine

## 2021-09-09 LAB — CBC WITH DIFFERENTIAL/PLATELET
Basophils Absolute: 0 10*3/uL (ref 0.0–0.1)
Basophils Relative: 0.6 % (ref 0.0–3.0)
Eosinophils Absolute: 0.1 10*3/uL (ref 0.0–0.7)
Eosinophils Relative: 1.3 % (ref 0.0–5.0)
HCT: 42.3 % (ref 36.0–46.0)
Hemoglobin: 14.3 g/dL (ref 12.0–15.0)
Lymphocytes Relative: 34.8 % (ref 12.0–46.0)
Lymphs Abs: 2.6 10*3/uL (ref 0.7–4.0)
MCHC: 33.7 g/dL (ref 30.0–36.0)
MCV: 88.1 fl (ref 78.0–100.0)
Monocytes Absolute: 0.6 10*3/uL (ref 0.1–1.0)
Monocytes Relative: 8.6 % (ref 3.0–12.0)
Neutro Abs: 4.1 10*3/uL (ref 1.4–7.7)
Neutrophils Relative %: 54.7 % (ref 43.0–77.0)
Platelets: 315 10*3/uL (ref 150.0–400.0)
RBC: 4.8 Mil/uL (ref 3.87–5.11)
RDW: 12.9 % (ref 11.5–15.5)
WBC: 7.5 10*3/uL (ref 4.0–10.5)

## 2021-09-09 LAB — COMPREHENSIVE METABOLIC PANEL
ALT: 28 U/L (ref 0–35)
AST: 25 U/L (ref 0–37)
Albumin: 4.5 g/dL (ref 3.5–5.2)
Alkaline Phosphatase: 61 U/L (ref 39–117)
BUN: 14 mg/dL (ref 6–23)
CO2: 30 mEq/L (ref 19–32)
Calcium: 9.7 mg/dL (ref 8.4–10.5)
Chloride: 101 mEq/L (ref 96–112)
Creatinine, Ser: 0.68 mg/dL (ref 0.40–1.20)
GFR: 120.7 mL/min (ref >60.00)
Glucose, Bld: 74 mg/dL (ref 70–99)
Potassium: 3.9 mEq/L (ref 3.5–5.1)
Sodium: 138 mEq/L (ref 135–145)
Total Bilirubin: 0.6 mg/dL (ref 0.2–1.2)
Total Protein: 7.4 g/dL (ref 6.0–8.3)

## 2021-09-09 LAB — VITAMIN B12: Vitamin B-12: 406 pg/mL (ref 211–911)

## 2021-09-09 LAB — HEMOGLOBIN A1C: Hgb A1c MFr Bld: 5.3 % (ref 4.6–6.5)

## 2021-09-20 ENCOUNTER — Encounter: Payer: Self-pay | Admitting: Nurse Practitioner

## 2021-10-02 DIAGNOSIS — J020 Streptococcal pharyngitis: Secondary | ICD-10-CM | POA: Diagnosis not present

## 2021-10-03 ENCOUNTER — Ambulatory Visit: Payer: Medicaid Other

## 2021-10-14 ENCOUNTER — Ambulatory Visit (INDEPENDENT_AMBULATORY_CARE_PROVIDER_SITE_OTHER): Payer: Medicaid Other | Admitting: Family Medicine

## 2021-10-14 VITALS — BP 116/68 | HR 82 | Temp 98.4°F | Ht 62.5 in | Wt 164.6 lb

## 2021-10-14 DIAGNOSIS — E663 Overweight: Secondary | ICD-10-CM

## 2021-10-14 DIAGNOSIS — M79672 Pain in left foot: Secondary | ICD-10-CM

## 2021-10-14 DIAGNOSIS — J301 Allergic rhinitis due to pollen: Secondary | ICD-10-CM | POA: Diagnosis not present

## 2021-10-14 DIAGNOSIS — Z0000 Encounter for general adult medical examination without abnormal findings: Secondary | ICD-10-CM

## 2021-10-14 NOTE — Addendum Note (Signed)
Addended by: Loyola Mast on: 10/14/2021 10:04 AM ? ? Modules accepted: Level of Service ? ?

## 2021-10-14 NOTE — Progress Notes (Signed)
?[facility] ?[facility] ?[address] ?[place] [address] ?Dept: [phone] ?Dept Fax: [phone] ? ?Annual Physical Visit ? ?Subjective:  ? ? Patient ID: Diamond Smith, female    DOB: 1996-03-09, 26 y.o..   MRN: [id number] ? ?Chief Complaint  ?Patient presents with  ? Annual Exam  ?  CPE/labs. Fasting today.  C/o still having a ST in am and weight  ? ? ?History of Present Illness: ? ?Patient is in today for an annual physical/preventative visit. ? ?Diamond Smith is doing well overall. 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 ? ?Review of Systems  ?Constitutional: Negative.   ?     Concerned that she is having trouble losing weight. Notes she has reduced eating out and following a diet to reduce carb and fatty food intake. She is exercising at a gym 4 times a week for an hour, with a mix of cardio and strength training. For cardio, she walks on a treadmill at a 15? incline at 3 mph. Her sense is she is gainign weight. She would prefer to be in the 140s.  ?HENT:  Positive for congestion and sore throat. Negative for ear discharge, ear pain, hearing loss and sinus pain.   ?     Had Strep throat 2 weeks ago. This is mostly resolved. Mild congestion still. Noting some itchy throat.  ?Eyes: Negative.   ?     Occasional eye redness.  ?Respiratory:  Negative for cough, shortness of breath and wheezing.   ?Cardiovascular:  Negative for chest pain, palpitations and leg swelling.  ?Gastrointestinal:  Negative for abdominal pain, constipation, diarrhea, heartburn, nausea and vomiting.  ?Genitourinary: Negative.   ?Musculoskeletal:  Negative for back pain, joint pain and neck pain.  ?     Notes an occasional pain of the left foot near the heel.  ?Skin: Negative.   ?Neurological:  Positive for tingling.  ?     Had been seen about a month ago with some numbness and tingling in the hands. This is improved at this point.  ? ?Past Medical History: ?Patient Active Problem List   ? Diagnosis Date Noted  ? Idiopathic urticaria 03/28/2021  ? Allergic rhinitis 03/28/2021  ? Low back pain 09/12/2018  ? 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 Gastroesophageal reflux disease without esophagitis 09/14/2017  ? Gestational hypertension 03/30/2015  ? ?No past surgical history on file. ? ?Family History  ?Problem Relation Age of Onset  ? Hypertension Mother   ? ?Outpatient Medications Prior to Visit  ?Medication Sig Dispense Refill  ? levonorgestrel (MIRENA, 52 MG,) 20 MCG/24HR IUD Mirena 20 mcg/24 hours (6 yrs) 52 mg intrauterine device ? Take 1 device by intrauterine route.    ? ?No facility-administered medications prior to visit.  ? ?Allergies  ?Allergen Reactions  ? Covid-19 (Mrna) Vaccine Hives  ?   ?Objective:  ? ?Today's Vitals  ? 10/14/21 0817  ?BP: 116/68  ?Pulse: 82  ?Temp: 98.4 ?F (36.9 ?C)  ?TempSrc: Temporal  ?SpO2: 97%  ?Weight: 164 lb 9.6 oz (74.7 kg)  ?Height: 5' 2.5" (1.588 m)  ? ?Body mass index is 29.63 kg/m?.  ? ?General: Well developed, well nourished. No acute distress. ?HEENT: Normocephalic, non-traumatic. PERRL, EOMI. Conjunctiva mildly red. External ears normal. EAC and TMs  ? normal bilaterally. Nose with moderate congestion and mild rhinorrhea. Mucous membranes moist. Mild mucous  ? streaking of posterior oropharynx clear. Good dentition. ?Neck:  Supple. No lymphadenopathy. No thyromegaly. ?Lungs: Clear to auscultation bilaterally. No wheezing, rales or rhonchi. ?CV: RRR without murmurs or rubs. Pulses 2+ bilaterally. ?Abdomen: Soft, non-tender. Bowel sounds positive, normal pitch and frequency. No hepatosplenomegaly. No rebound  ? or guarding. ?Extremities: Full ROM. No joint swelling or tenderness. No edema noted. Paion of left heel noted on the sole near the  ? calcaneus. ?Skin: Warm and dry. No rashes. ?Neuro: Normal sensation to both hands. Tinel's and Phalen's tests normal. ?Psych: Alert and oriented. Normal mood and  affect. ? ?Health Maintenance Due  ?Topic Date Due  ? HPV VACCINES (1 - 2-dose series) Never done  ? Hepatitis C Screening  Never done  ? PAP-Cervical Cytology Screening  Never done  ? PAP SMEAR-Modifier  Never done  ?   ?Lab Results: ?Lab Results  ?Component Value Date  ? HGBA1C 5.3 09/08/2021  ? ? ?  Latest Ref Rng & Units 09/08/2021  ?  3:45 PM 10/12/2020  ?  9:24 AM 03/30/2015  ?  7:00 PM  ?CMP  ?Glucose 70 - 99 mg/dL 74   89   74    ?BUN 6 - 23 mg/dL 14   15   10     ?Creatinine 0.40 - 1.20 mg/dL 1.610.68   0.960.58   0.450.53    ?Sodium 135 - 145 mEq/L 138   136   132    ?Potassium 3.5 - 5.1 mEq/L 3.9   4.7   4.0    ?Chloride 96 - 112 mEq/L 101   102   100    ?CO2 19 - 32 mEq/L 30   26   21     ?Calcium 8.4 - 10.5 mg/dL 9.7   9.1   9.4    ?Total Protein 6.0 - 8.3 g/dL 7.4   7.2   6.7    ?Total Bilirubin 0.2 - 1.2 mg/dL 0.6   0.8   0.2    ?Alkaline Phos 39 - 117 U/L 61   74   279    ?AST 0 - 37 U/L 25   21   30     ?ALT 0 - 35 U/L 28   22   26     ? ? ?  Latest Ref Rng & Units 09/08/2021  ?  3:45 PM 10/12/2020  ?  9:24 AM 07/24/2019  ?  6:31 AM  ?CBC  ?WBC 4.0 - 10.5 K/uL 7.5   6.7   9.5    ?Hemoglobin 12.0 - 15.0 g/dL 40.914.3   81.114.6   91.410.6    ?Hematocrit 36.0 - 46.0 % 42.3   43.6   32.0    ?Platelets 150.0 - 400.0 K/uL 315.0   293.0   191    ? ? ?Assessment & Plan:  ? ?1. Annual physical exam ?Overall good health. Recent screening labs normal. UTD on immunizations. Prior CTS-like symptoms now resolved and no evidence of this on exam today. ? ?2. Seasonal allergic rhinitis due to pollen ?Recommend Diamond Smith start taking an OTC antihistamine for allergy symptoms. As she is having morning symptoms, recommend she switch out her pillow. ? ?3. Overweight (BMI 25.0-29.9) ?Reviewed weights. They have fluctuated within a 5 lb. range over past year. We discussed diet and exercise recommendations. I encouraged ongoing incremental efforts in improving her diet. She would like to meet with a nutritionist to discuss. ? ?- Amb ref to Medical  Nutrition Therapy-MNT ? ?4. Left foot pain ?This could be some mild plantar fasciitis. I recommend  she do stretches for her foot with a frozen water bottle. She will follow up if not improving. ? ? ?Return in about 1 year (around 10/15/2022) for Reassessment.  ? ?Loyola Mast, MD ?

## 2021-10-17 ENCOUNTER — Ambulatory Visit: Payer: Medicaid Other | Admitting: Nurse Practitioner

## 2021-10-17 ENCOUNTER — Encounter: Payer: Self-pay | Admitting: Nurse Practitioner

## 2021-10-17 VITALS — BP 127/90 | HR 81 | Temp 98.2°F | Wt 166.0 lb

## 2021-10-17 DIAGNOSIS — J029 Acute pharyngitis, unspecified: Secondary | ICD-10-CM | POA: Insufficient documentation

## 2021-10-17 LAB — POCT RAPID STREP A (OFFICE): Rapid Strep A Screen: NEGATIVE

## 2021-10-17 MED ORDER — FLUTICASONE PROPIONATE 50 MCG/ACT NA SUSP: 2.0000 | Freq: Every day | NASAL | 6 refills | Status: DC

## 2021-10-17 NOTE — Patient Instructions (Signed)
It was great to see you! ? ?Continue Allegra daily.  Start Flonase nasal spray daily.  Make sure you are drinking plenty of fluids ? ?Let's follow-up if your symptoms worsen or do not improve. ? ?Take care, ? ?Rodman Pickle, NP ? ?

## 2021-10-17 NOTE — Progress Notes (Signed)
? ?Acute Office Visit ? ?Subjective:  ? ? Patient ID: Diamond Smith, female    DOB: Feb 08, 1996, 26 y.o.   MRN: [id number] ? ?Chief Complaint  ?Patient presents with  ? Sore Throat  ?  Pt c/o sore throat w/ painful swallowing x4 days. Pt was dx w/ strep x3 wks ago  ? ? ?Sore Throat  ? ?Patient is in today for 3 weeks ago she was diagnosed with strep throat. She was prescribed antibiotics and finished them last Wednesday. Then Thursday her sore throat returned. She endorses sneezing that has improved. She took zyrtec, cough drops, and allegra. She saw PCP who diagnosed her with allergies. She endorses coughing at night. Denies fevers, ear pain/pressure, chest pain and shortness of breath. She would like a strep test just to verify she doesn't still have strep since she will be going on vacation.  ? ?Past Medical History:  ?Diagnosis Date  ? GERD (gastroesophageal reflux disease)   ? ? ?History reviewed. No pertinent surgical history. ? ?Family History  ?Problem Relation Age of Onset  ? Hypertension Mother   ? ? ?Social History  ? ?Socioeconomic History  ? Marital status: Married  ?  Spouse name: Not on file  ? Number of children: Not on file  ? Years of education: Not on file  ? Highest education level: Not on file  ?Occupational History  ? Not on file  ?Tobacco Use  ? Smoking status: Never  ? Smokeless tobacco: Never  ?Vaping Use  ? Vaping Use: Never used  ?Substance and Sexual Activity  ? Alcohol use: No  ? Drug use: Never  ? Sexual activity: Yes  ?Other Topics Concern  ? Not on file  ?Social History Narrative  ? Not on file  ? ?Social Determinants of Health  ? ?Financial Resource Strain: Not on file  ?Food Insecurity: Not on file  ?Transportation Needs: Not on file  ?Physical Activity: Not on file  ?Stress: Not on file  ?Social Connections: Not on file  ?Intimate Partner Violence: Not on file  ? ? ?Outpatient Medications Prior to Visit  ?Medication Sig Dispense Refill  ? Fexofenadine HCl (ALLEGRA ALLERGY PO) Take by  mouth.    ? levonorgestrel (MIRENA, 52 MG,) 20 MCG/24HR IUD Mirena 20 mcg/24 hours (6 yrs) 52 mg intrauterine device ? Take 1 device by intrauterine route.    ? ?No facility-administered medications prior to visit.  ? ? ?Allergies  ?Allergen Reactions  ? Covid-19 (Mrna) Vaccine Hives  ? ? ?Review of Systems ?See pertinent positives and negatives per HPI. ?   ?Objective:  ?  ?Physical Exam ?Vitals and nursing note reviewed.  ?Constitutional:   ?   General: She is not in acute distress. ?   Appearance: Normal appearance.  ?HENT:  ?   Head: Normocephalic.  ?   Right Ear: Tympanic membrane, ear canal and external ear normal.  ?   Left Ear: Tympanic membrane, ear canal and external ear normal.  ?   Nose: Nose normal.  ?   Mouth/Throat:  ?   Mouth: Mucous membranes are moist.  ?   Pharynx: Posterior oropharyngeal erythema present. No oropharyngeal exudate.  ?Eyes:  ?   Conjunctiva/sclera: Conjunctivae normal.  ?Cardiovascular:  ?   Rate and Rhythm: Normal rate and regular rhythm.  ?   Pulses: Normal pulses.  ?   Heart sounds: Normal heart sounds.  ?Pulmonary:  ?   Effort: Pulmonary effort is normal.  ?   Breath sounds: Normal breath  sounds.  ?Musculoskeletal:  ?   Cervical back: Normal range of motion.  ?Skin: ?   General: Skin is warm.  ?Neurological:  ?   General: No focal deficit present.  ?   Mental Status: She is alert and oriented to person, place, and time.  ?Psychiatric:     ?   Mood and Affect: Mood normal.     ?   Behavior: Behavior normal.     ?   Thought Content: Thought content normal.     ?   Judgment: Judgment normal.  ? ? ?BP 127/90 (BP Location: Left Arm, Patient Position: Sitting, Cuff Size: Normal)   Pulse 81   Temp 98.2 ?F (36.8 ?C) (Temporal)   Wt 166 lb (75.3 kg)   SpO2 99%   BMI 29.88 kg/m?  ?Wt Readings from Last 3 Encounters:  ?10/17/21 166 lb (75.3 kg)  ?10/14/21 164 lb 9.6 oz (74.7 kg)  ?09/08/21 166 lb 6.4 oz (75.5 kg)  ? ? ?Health Maintenance Due  ?Topic Date Due  ? HPV VACCINES (1 -  2-dose series) Never done  ? Hepatitis C Screening  Never done  ? PAP-Cervical Cytology Screening  Never done  ? ? ?   ?Topic Date Due  ? HPV VACCINES (1 - 2-dose series) Never done  ? ? ? ?Lab Results  ?Component Value Date  ? TSH 2.52 10/12/2020  ? ?Lab Results  ?Component Value Date  ? WBC 7.5 09/08/2021  ? HGB 14.3 09/08/2021  ? HCT 42.3 09/08/2021  ? MCV 88.1 09/08/2021  ? PLT 315.0 09/08/2021  ? ?Lab Results  ?Component Value Date  ? NA 138 09/08/2021  ? K 3.9 09/08/2021  ? CO2 30 09/08/2021  ? GLUCOSE 74 09/08/2021  ? BUN 14 09/08/2021  ? CREATININE 0.68 09/08/2021  ? BILITOT 0.6 09/08/2021  ? ALKPHOS 61 09/08/2021  ? AST 25 09/08/2021  ? ALT 28 09/08/2021  ? PROT 7.4 09/08/2021  ? ALBUMIN 4.5 09/08/2021  ? CALCIUM 9.7 09/08/2021  ? ANIONGAP 11 03/30/2015  ? GFR 120.70 09/08/2021  ? ?No results found for: CHOL ?No results found for: HDL ?No results found for: LDLCALC ?No results found for: TRIG ?No results found for: CHOLHDL ?Lab Results  ?Component Value Date  ? HGBA1C 5.3 09/08/2021  ? ? ?   ?Assessment & Plan:  ? ?Problem List Items Addressed This Visit   ? ?  ? Other  ? Sore throat - Primary  ?  Rapid strep in office negative.  Symptoms most consistent with allergies.  Continue Allegra and start Flonase nasal spray daily.  Encourage plenty of fluids.  Follow-up if symptoms worsen or do not improve. ?  ?  ? Relevant Orders  ? POCT rapid strep A (Completed)  ? ? ? ?Meds ordered this encounter  ?Medications  ? fluticasone (FLONASE) 50 MCG/ACT nasal spray  ?  Sig: Place 2 sprays into both nostrils daily.  ?  Dispense:  16 g  ?  Refill:  6  ? ? ? ?Gerre Scull, NP ? ?

## 2021-10-17 NOTE — Assessment & Plan Note (Signed)
Rapid strep in office negative.  Symptoms most consistent with allergies.  Continue Allegra and start Flonase nasal spray daily.  Encourage plenty of fluids.  Follow-up if symptoms worsen or do not improve. ?

## 2021-10-19 ENCOUNTER — Encounter: Payer: Self-pay | Admitting: Nurse Practitioner

## 2021-10-19 ENCOUNTER — Encounter: Payer: Medicaid Other | Attending: Family Medicine | Admitting: Registered"

## 2021-10-19 DIAGNOSIS — J039 Acute tonsillitis, unspecified: Secondary | ICD-10-CM | POA: Diagnosis not present

## 2021-10-19 MED ORDER — PREDNISONE 10 MG PO TABS
ORAL_TABLET | ORAL | 0 refills | Status: DC
Start: 2021-10-19 — End: 2022-09-07

## 2021-11-15 ENCOUNTER — Encounter: Payer: Medicaid Other | Admitting: Family Medicine

## 2021-12-06 ENCOUNTER — Ambulatory Visit: Payer: Medicaid Other | Admitting: Registered"

## 2022-03-08 DIAGNOSIS — H8110 Benign paroxysmal vertigo, unspecified ear: Secondary | ICD-10-CM | POA: Diagnosis not present

## 2022-04-25 DIAGNOSIS — Z23 Encounter for immunization: Secondary | ICD-10-CM | POA: Diagnosis not present

## 2022-05-01 ENCOUNTER — Ambulatory Visit: Payer: Medicaid Other

## 2022-06-27 LAB — HM PAP SMEAR

## 2022-07-20 DIAGNOSIS — Z124 Encounter for screening for malignant neoplasm of cervix: Secondary | ICD-10-CM | POA: Diagnosis not present

## 2022-07-20 DIAGNOSIS — Z0000 Encounter for general adult medical examination without abnormal findings: Secondary | ICD-10-CM | POA: Diagnosis not present

## 2022-07-20 LAB — HM PAP SMEAR

## 2022-08-04 DIAGNOSIS — L500 Allergic urticaria: Secondary | ICD-10-CM | POA: Diagnosis not present

## 2022-08-25 DIAGNOSIS — J3089 Other allergic rhinitis: Secondary | ICD-10-CM | POA: Diagnosis not present

## 2022-08-25 DIAGNOSIS — L501 Idiopathic urticaria: Secondary | ICD-10-CM | POA: Diagnosis not present

## 2022-08-31 DIAGNOSIS — L305 Pityriasis alba: Secondary | ICD-10-CM | POA: Diagnosis not present

## 2022-09-07 ENCOUNTER — Ambulatory Visit: Payer: Medicaid Other | Admitting: Family Medicine

## 2022-09-07 ENCOUNTER — Encounter: Payer: Self-pay | Admitting: Family Medicine

## 2022-09-07 VITALS — BP 118/66 | HR 68 | Temp 98.5°F | Ht 62.5 in | Wt 165.0 lb

## 2022-09-07 DIAGNOSIS — L501 Idiopathic urticaria: Secondary | ICD-10-CM

## 2022-09-07 DIAGNOSIS — Z8759 Personal history of other complications of pregnancy, childbirth and the puerperium: Secondary | ICD-10-CM | POA: Insufficient documentation

## 2022-09-07 DIAGNOSIS — E559 Vitamin D deficiency, unspecified: Secondary | ICD-10-CM | POA: Diagnosis not present

## 2022-09-07 DIAGNOSIS — E785 Hyperlipidemia, unspecified: Secondary | ICD-10-CM | POA: Diagnosis not present

## 2022-09-07 NOTE — Assessment & Plan Note (Addendum)
We discussed the role of taking her Xyzal 5 mg and montelukast 10 mg daily for control of her symptoms. Specific allergy testing would be of low benefit.

## 2022-09-07 NOTE — Progress Notes (Signed)
[facility] [address] [place] [address] Dept: [phone] Dept Fax: [phone]  Office Visit  Subjective:    Patient ID: Diamond Smith, female    DOB: Nov 20, 1995, 27 y.o..   MRN: [id number]  Chief Complaint  Patient presents with   Medical Management of Chronic Issues    F/u    History of Present Illness:  Patient is in today to review the results of some recent lab work. She had this done at a weight loss center.  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 She has been seen by both an allergist and a dermatologist. She was given steroid creams and started on levocetirizine (Xyzal) 5 mg and montelukast 10 mg to take daily. She has been using these intermittently and does still have some outbreaks of her hives.    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 No pertinent surgical history. Family History  Problem Relation Age of Onset   Hypertension Mother    Outpatient Medications Prior to Visit  Medication Sig Dispense Refill   levonorgestrel (MIRENA, 52 MG,) 20 MCG/24HR IUD Mirena 20 mcg/24 hours (6 yrs) 52 mg intrauterine device  Take 1 device by intrauterine route.     mometasone (ELOCON) 0.1 % cream 1 application Externally Twice a day for 90 days     montelukast (SINGULAIR) 10 MG tablet 1 tablet Orally Once a day for 90 days     NASACORT ALLERGY 24HR 55 MCG/ACT AERO nasal inhaler Place 2 sprays into the nose daily.     triamcinolone cream (KENALOG) 0.1 % SMARTSIG:1 Application Topical 2-3  Times Daily     XYZAL ALLERGY 24HR 5 MG tablet Take 5 mg by mouth every evening.     Fexofenadine HCl (ALLEGRA ALLERGY PO) Take by mouth.     fluticasone (FLONASE) 50 MCG/ACT nasal spray Place 2 sprays into both nostrils daily. 16 g 6   predniSONE (DELTASONE) 10 MG tablet Take 6 tablets today, then 5 tablets tomorrow, then decrease by 1 tablet every day until gone 21 tablet 0   No facility-administered medications prior to visit.   Allergies  Allergen Reactions   Covid-19 (Mrna) Vaccine Hives     Objective:   Today's Vitals   09/07/22 0924  BP: 118/66  Pulse: 68  Temp: 98.5 F (36.9 C)  TempSrc: Temporal  SpO2: 97%  Weight: 165 lb (74.8 kg)  Height: 5' 2.5" (1.588 m)   Body mass index is 29.7 kg/m.   General: Well developed, well nourished. No acute distress. Psych: Alert and oriented. Normal mood and affect.  Health Maintenance Due  Topic Date Due   HPV VACCINES (1 - 2-dose series) Never done   Hepatitis C Screening  Never done   PAP-Cervical Cytology Screening  Never done    Lab Results (08/29/2022)  Lipid Panel  Total Cholesterol: 216 mg/dL  Triglycerides:  194 mg/dL  LDL Cholesterol: 139 mg/dl  Hemoglobin A1c: 5.5 %  TSH: 1.99 mU/L  Comprehensive Metabolic Panel  Sodium: XX123456 mEq/L  Potassium: 4.2 mEq/L  Chloride: 104 mEq/L  CO2:  20 mmol/L  Glucose:  91 mg/dL  BUN:  12 mg/dL  Creatinine: 0.6 mg/dL   Complete Blood Count:  WBC:  11.1 K/uL  Vitamin D: 17.3    Assessment & Plan:   Problem List Items Addressed This Visit       Musculoskeletal and Integument   Idiopathic urticaria - Primary    We discussed the role of taking her Xyzal 5 mg and montelukast 10 mg daily for control of her symptoms. Specific allergy testing would be of low benefit.        Other   Vitamin D deficiency    I recommend she take an OTC Vit D supplement.      Borderline hyperlipidemia    Overall low risk of CV disease in next 10 years. I recommend eating a  heart-healthy diet (DASH diet or Mediterranean), getting 150 minutes of moderate-intensity exercise each week, achieving a normal weight, and avoiding tobacco products.        Return in about 2 months (around 11/06/2022) for Annual preventative care.   Haydee Salter, MD

## 2022-09-07 NOTE — Assessment & Plan Note (Signed)
I recommend she take an OTC Vit D supplement.

## 2022-09-07 NOTE — Assessment & Plan Note (Signed)
Overall low risk of CV disease in next 10 years. I recommend eating a heart-healthy diet (DASH diet or Mediterranean), getting 150 minutes of moderate-intensity exercise each week, achieving a normal weight, and avoiding tobacco products.

## 2022-09-12 ENCOUNTER — Encounter: Payer: Self-pay | Admitting: Family Medicine

## 2022-11-06 ENCOUNTER — Encounter: Payer: Medicaid Other | Admitting: Family Medicine

## 2023-03-28 ENCOUNTER — Encounter: Payer: Self-pay | Admitting: Family Medicine

## 2023-03-28 DIAGNOSIS — Z30432 Encounter for removal of intrauterine contraceptive device: Secondary | ICD-10-CM | POA: Diagnosis not present

## 2023-06-19 ENCOUNTER — Encounter: Payer: Self-pay | Admitting: Family Medicine

## 2023-06-19 DIAGNOSIS — J301 Allergic rhinitis due to pollen: Secondary | ICD-10-CM

## 2023-06-19 MED ORDER — CETIRIZINE HCL 10 MG PO TABS: 10.0000 mg | ORAL_TABLET | Freq: Every day | ORAL | 11 refills | Status: DC

## 2023-06-26 MED ORDER — CETIRIZINE HCL 10 MG PO TABS: 10.0000 mg | ORAL_TABLET | Freq: Every day | ORAL | 11 refills | Status: DC

## 2023-06-26 NOTE — Addendum Note (Signed)
Addended by: Waymond Cera on: 06/26/2023 03:10 PM   Modules accepted: Orders

## 2023-06-26 NOTE — Telephone Encounter (Signed)
please review and advise.  Thanks. Dm/cma

## 2023-07-09 ENCOUNTER — Encounter: Payer: Self-pay | Admitting: Family Medicine

## 2023-07-09 DIAGNOSIS — Z3480 Encounter for supervision of other normal pregnancy, unspecified trimester: Secondary | ICD-10-CM | POA: Diagnosis not present

## 2023-07-09 DIAGNOSIS — O26841 Uterine size-date discrepancy, first trimester: Secondary | ICD-10-CM | POA: Diagnosis not present

## 2023-07-09 DIAGNOSIS — Z3201 Encounter for pregnancy test, result positive: Secondary | ICD-10-CM | POA: Diagnosis not present

## 2023-07-09 DIAGNOSIS — Z113 Encounter for screening for infections with a predominantly sexual mode of transmission: Secondary | ICD-10-CM | POA: Diagnosis not present

## 2023-07-18 NOTE — L&D Delivery Note (Addendum)
 Delivery Note Following AROM, patient progressed along a normal labor curve.  She pushed well for about 10 minutes. At 6:49 AM a viable female was delivered via Vaginal, Spontaneous (Presentation: LOA     ).  APGAR: 9, 9; weight 5 lb 15.6 oz (2710 gm) .   Placenta status: Spontaneous, Intact.  Cord: 3 vessels with the following complications: None.  Cord pH: n/a  Anesthesia: Epidural Episiotomy: None Lacerations: None Suture Repair: n/a Est. Blood Loss (mL): 129 mL  Mom to postpartum.  Baby to Couplet care / Skin to Skin.  Diamond Smith Sun 02/06/2024, 7:18 AM

## 2023-08-08 ENCOUNTER — Encounter: Payer: Self-pay | Admitting: Family Medicine

## 2023-08-08 DIAGNOSIS — Z3481 Encounter for supervision of other normal pregnancy, first trimester: Secondary | ICD-10-CM | POA: Diagnosis not present

## 2023-08-08 DIAGNOSIS — Z3480 Encounter for supervision of other normal pregnancy, unspecified trimester: Secondary | ICD-10-CM | POA: Diagnosis not present

## 2023-08-08 LAB — OB RESULTS CONSOLE HIV ANTIBODY (ROUTINE TESTING): HIV: NONREACTIVE

## 2023-08-08 LAB — OB RESULTS CONSOLE RUBELLA ANTIBODY, IGM: Rubella: IMMUNE

## 2023-08-08 LAB — OB RESULTS CONSOLE RPR: RPR: NONREACTIVE

## 2023-08-08 LAB — OB RESULTS CONSOLE HEPATITIS B SURFACE ANTIGEN: Hepatitis B Surface Ag: NEGATIVE

## 2023-08-08 LAB — OB RESULTS CONSOLE GC/CHLAMYDIA
Chlamydia: NEGATIVE
Neisseria Gonorrhea: NEGATIVE

## 2023-08-08 LAB — HEPATITIS C ANTIBODY: HCV Ab: NEGATIVE

## 2023-08-29 DIAGNOSIS — L501 Idiopathic urticaria: Secondary | ICD-10-CM | POA: Diagnosis not present

## 2023-08-29 DIAGNOSIS — J3089 Other allergic rhinitis: Secondary | ICD-10-CM | POA: Diagnosis not present

## 2023-09-05 DIAGNOSIS — Z3482 Encounter for supervision of other normal pregnancy, second trimester: Secondary | ICD-10-CM | POA: Diagnosis not present

## 2023-10-05 ENCOUNTER — Encounter: Payer: Self-pay | Admitting: Family Medicine

## 2023-10-05 DIAGNOSIS — Z363 Encounter for antenatal screening for malformations: Secondary | ICD-10-CM | POA: Diagnosis not present

## 2023-10-05 DIAGNOSIS — Z3A19 19 weeks gestation of pregnancy: Secondary | ICD-10-CM | POA: Diagnosis not present

## 2023-11-07 DIAGNOSIS — Z3A24 24 weeks gestation of pregnancy: Secondary | ICD-10-CM | POA: Diagnosis not present

## 2023-11-07 DIAGNOSIS — Z369 Encounter for antenatal screening, unspecified: Secondary | ICD-10-CM | POA: Diagnosis not present

## 2023-12-05 DIAGNOSIS — Z23 Encounter for immunization: Secondary | ICD-10-CM | POA: Diagnosis not present

## 2024-01-28 DIAGNOSIS — R030 Elevated blood-pressure reading, without diagnosis of hypertension: Secondary | ICD-10-CM | POA: Diagnosis not present

## 2024-01-28 DIAGNOSIS — Z3480 Encounter for supervision of other normal pregnancy, unspecified trimester: Secondary | ICD-10-CM | POA: Diagnosis not present

## 2024-01-28 LAB — OB RESULTS CONSOLE GBS: GBS: POSITIVE

## 2024-01-31 ENCOUNTER — Other Ambulatory Visit (HOSPITAL_COMMUNITY): Payer: Self-pay

## 2024-02-04 ENCOUNTER — Telehealth (HOSPITAL_COMMUNITY): Payer: Self-pay | Admitting: *Deleted

## 2024-02-04 ENCOUNTER — Encounter (HOSPITAL_COMMUNITY): Payer: Self-pay | Admitting: *Deleted

## 2024-02-04 NOTE — Telephone Encounter (Signed)
 Preadmission screen

## 2024-02-05 ENCOUNTER — Other Ambulatory Visit: Payer: Self-pay

## 2024-02-05 ENCOUNTER — Inpatient Hospital Stay (HOSPITAL_COMMUNITY)
Admission: RE | Admit: 2024-02-05 | Discharge: 2024-02-08 | DRG: 807 | Disposition: A | Discharge status: Home / Self Care | Attending: Obstetrics | Admitting: Obstetrics

## 2024-02-05 ENCOUNTER — Inpatient Hospital Stay (HOSPITAL_COMMUNITY)

## 2024-02-05 ENCOUNTER — Encounter (HOSPITAL_COMMUNITY): Payer: Self-pay | Admitting: Obstetrics

## 2024-02-05 DIAGNOSIS — O9962 Diseases of the digestive system complicating childbirth: Secondary | ICD-10-CM | POA: Diagnosis not present

## 2024-02-05 DIAGNOSIS — O134 Gestational [pregnancy-induced] hypertension without significant proteinuria, complicating childbirth: Secondary | ICD-10-CM | POA: Diagnosis not present

## 2024-02-05 DIAGNOSIS — K219 Gastro-esophageal reflux disease without esophagitis: Secondary | ICD-10-CM | POA: Diagnosis present

## 2024-02-05 DIAGNOSIS — O133 Gestational [pregnancy-induced] hypertension without significant proteinuria, third trimester: Principal | ICD-10-CM | POA: Diagnosis present

## 2024-02-05 DIAGNOSIS — Z3A37 37 weeks gestation of pregnancy: Secondary | ICD-10-CM

## 2024-02-05 DIAGNOSIS — O99824 Streptococcus B carrier state complicating childbirth: Secondary | ICD-10-CM | POA: Diagnosis not present

## 2024-02-05 DIAGNOSIS — I10 Essential (primary) hypertension: Secondary | ICD-10-CM | POA: Diagnosis present

## 2024-02-05 LAB — COMPREHENSIVE METABOLIC PANEL WITH GFR
ALT: 24 U/L (ref 0–44)
AST: 37 U/L (ref 15–41)
Albumin: 2.7 g/dL — ABNORMAL LOW (ref 3.5–5.0)
Alkaline Phosphatase: 173 U/L — ABNORMAL HIGH (ref 38–126)
Anion gap: 11 (ref 5–15)
BUN: 8 mg/dL (ref 6–20)
CO2: 19 mmol/L — ABNORMAL LOW (ref 22–32)
Calcium: 8.7 mg/dL — ABNORMAL LOW (ref 8.9–10.3)
Chloride: 107 mmol/L (ref 98–111)
Creatinine, Ser: 0.54 mg/dL (ref 0.44–1.00)
GFR, Estimated: >60 mL/min (ref >60)
Glucose, Bld: 82 mg/dL (ref 70–99)
Potassium: 3.7 mmol/L (ref 3.5–5.1)
Sodium: 137 mmol/L (ref 135–145)
Total Bilirubin: 0.6 mg/dL (ref 0.0–1.2)
Total Protein: 6.2 g/dL — ABNORMAL LOW (ref 6.5–8.1)

## 2024-02-05 LAB — TYPE AND SCREEN
ABO/RH(D): A POS
Antibody Screen: NEGATIVE

## 2024-02-05 LAB — CBC
HCT: 37.5 % (ref 36.0–46.0)
Hemoglobin: 12.6 g/dL (ref 12.0–15.0)
MCH: 29.9 pg (ref 26.0–34.0)
MCHC: 33.6 g/dL (ref 30.0–36.0)
MCV: 89.1 fL (ref 80.0–100.0)
Platelets: 210 K/uL (ref 150–400)
RBC: 4.21 MIL/uL (ref 3.87–5.11)
RDW: 13.2 % (ref 11.5–15.5)
WBC: 7.5 K/uL (ref 4.0–10.5)
nRBC: 0 % (ref 0.0–0.2)

## 2024-02-05 MED ORDER — LIDOCAINE HCL (PF) 1 % IJ SOLN: 30.0000 mL | INTRAMUSCULAR | Status: DC | PRN

## 2024-02-05 MED ORDER — LACTATED RINGERS IV SOLN: 500.0000 mL | INTRAVENOUS | Status: DC | PRN

## 2024-02-05 MED ORDER — TERBUTALINE SULFATE 1 MG/ML IJ SOLN: 0.2500 mg | Freq: Once | INTRAMUSCULAR | Status: DC | PRN

## 2024-02-05 MED ORDER — ACETAMINOPHEN 325 MG PO TABS: 650.0000 mg | ORAL_TABLET | ORAL | Status: DC | PRN

## 2024-02-05 MED ORDER — OXYTOCIN-SODIUM CHLORIDE 30-0.9 UT/500ML-% IV SOLN
2.5000 [IU]/h | INTRAVENOUS | Status: DC
  Administered 2024-02-06: 2.5 [IU]/h via INTRAVENOUS

## 2024-02-05 MED ORDER — ONDANSETRON HCL 4 MG/2ML IJ SOLN: 4.0000 mg | Freq: Four times a day (QID) | INTRAMUSCULAR | Status: DC | PRN

## 2024-02-05 MED ORDER — SOD CITRATE-CITRIC ACID 500-334 MG/5ML PO SOLN: 30.0000 mL | ORAL | Status: DC | PRN

## 2024-02-05 MED ORDER — OXYCODONE-ACETAMINOPHEN 5-325 MG PO TABS: 2.0000 | ORAL_TABLET | ORAL | Status: DC | PRN

## 2024-02-05 MED ORDER — OXYTOCIN-SODIUM CHLORIDE 30-0.9 UT/500ML-% IV SOLN
1.0000 m[IU]/min | INTRAVENOUS | Status: DC
  Administered 2024-02-05: 2 m[IU]/min via INTRAVENOUS
  Filled 2024-02-05: qty 500

## 2024-02-05 MED ORDER — OXYCODONE-ACETAMINOPHEN 5-325 MG PO TABS: 1.0000 | ORAL_TABLET | ORAL | Status: DC | PRN

## 2024-02-05 MED ORDER — PENICILLIN G POT IN DEXTROSE 60000 UNIT/ML IV SOLN
3.0000 10*6.[IU] | INTRAVENOUS | Status: DC
  Administered 2024-02-06 (×2): 3 10*6.[IU] via INTRAVENOUS
  Filled 2024-02-05 (×2): qty 50

## 2024-02-05 MED ORDER — PENICILLIN G POTASSIUM 5000000 UNITS IJ SOLR
5.0000 10*6.[IU] | Freq: Once | INTRAMUSCULAR | Status: AC
  Administered 2024-02-05: 5 10*6.[IU] via INTRAVENOUS
  Filled 2024-02-05: qty 5

## 2024-02-05 MED ORDER — OXYTOCIN BOLUS FROM INFUSION
333.0000 mL | Freq: Once | INTRAVENOUS | Status: AC
  Administered 2024-02-06: 333 mL via INTRAVENOUS

## 2024-02-05 MED ORDER — LACTATED RINGERS IV SOLN: INTRAVENOUS | Status: DC

## 2024-02-05 MED ORDER — FENTANYL CITRATE (PF) 100 MCG/2ML IJ SOLN: 50.0000 ug | INTRAMUSCULAR | Status: DC | PRN

## 2024-02-05 NOTE — H&P (Signed)
 28 y.o. H6E7997 @ [redacted]w[redacted]d presents for induction of labor for gestational hypertension.  She has a history of GHTN with G1.  She was seen for a routine visit at 36 weeks and noted to have newly elevated BPs.   Labs were normal, but she was noted to have persistently elevated diastolic blood pressure at f/u visit 2 days later, ruling her in for gestational hypertension. Otherwise has good fetal movement and no bleeding.  Pregnancy complicated by: History of gestational hypertension with G1   Past Medical History:  Diagnosis Date   GERD (gastroesophageal reflux disease)    Pregnancy induced hypertension    History reviewed. No pertinent surgical history.  OB History  Gravida Para Term Preterm AB Living  3 2 2  0 0 2  SAB IAB Ectopic Multiple Live Births  0 0 0 0 2    # Outcome Date GA Lbr Len/2nd Weight Sex Type Anes PTL Lv  3 Current           2 Term 07/23/19 [redacted]w[redacted]d 00:59 / 00:24 3575 g M Vag-Spont EPI  LIV  1 Term 04/01/15 [redacted]w[redacted]d 14:13 / 01:59 2960 g M Vag-Spont EPI  LIV    Social History   Socioeconomic History   Marital status: Married    Spouse name: Not on file   Number of children: Not on file   Years of education: Not on file   Highest education level: Not on file  Occupational History   Not on file  Tobacco Use   Smoking status: Never   Smokeless tobacco: Never  Vaping Use   Vaping status: Never Used  Substance and Sexual Activity   Alcohol use: No   Drug use: Never   Sexual activity: Yes  Other Topics Concern   Not on file  Social History Narrative   Not on file   Social Drivers of Health   Financial Resource Strain: Not on file  Food Insecurity: No Food Insecurity (02/05/2024)   Hunger Vital Sign    Worried About Running Out of Food in the Last Year: Never true    Ran Out of Food in the Last Year: Never true  Transportation Needs: No Transportation Needs (02/05/2024)   PRAPARE - Administrator, Civil Service (Medical): No    Lack of Transportation  (Non-Medical): No  Physical Activity: Not on file  Stress: Not on file  Social Connections: Not on file  Intimate Partner Violence: Not At Risk (02/05/2024)   Humiliation, Afraid, Rape, and Kick questionnaire    Fear of Current or Ex-Partner: No    Emotionally Abused: No    Physically Abused: No    Sexually Abused: No   Covid-19 (mrna) vaccine    Prenatal Transfer Tool  Maternal Diabetes: No Genetic Screening: Normal Maternal Ultrasounds/Referrals: Normal Fetal Ultrasounds or other Referrals:  None Maternal Substance Abuse:  No Significant Maternal Medications:  Meds include: Other: famotidine, levocetirizine, montelukast Significant Maternal Lab Results: Group B Strep positive Vaccines:   ABO, Rh: --/--/PENDING (07/22 2106) Antibody: PENDING (07/22 2106) Rubella: Immune (01/22 0000) RPR: Nonreactive (01/22 0000)  HBsAg: Negative (01/22 0000)  HIV: Non-reactive (01/22 0000)  GBS: Positive/-- (07/14 0000)     Vitals:   02/05/24 2106 02/05/24 2208  BP: (!) 146/100 (!) 144/91  Pulse: 83 77  Temp:  98.2 F (36.8 C)     General:  NAD Abdomen:  soft, gravid Ex:  trace edema SVE:  3/50/-3 FHTs:  140s, moderate variability, category 1 Toco:  quiet  Limited BSUS confirms cephalic presentation   A/P   28 y.o. H6E7997 [redacted]w[redacted]d presents with gestational hypertension IOL: cervix favorable, will start pitocin .  AROM when able GHTN: Labs pending, will monitor closely for progression to severe disease GBS positive: penicillin     Sibbie Flammia GEFFEL Aseel Uhde

## 2024-02-06 ENCOUNTER — Inpatient Hospital Stay (HOSPITAL_COMMUNITY): Admitting: Anesthesiology

## 2024-02-06 LAB — COMPREHENSIVE METABOLIC PANEL WITH GFR
ALT: 23 U/L (ref 0–44)
AST: 45 U/L — ABNORMAL HIGH (ref 15–41)
Albumin: 2.4 g/dL — ABNORMAL LOW (ref 3.5–5.0)
Alkaline Phosphatase: 150 U/L — ABNORMAL HIGH (ref 38–126)
Anion gap: 10 (ref 5–15)
BUN: 7 mg/dL (ref 6–20)
CO2: 20 mmol/L — ABNORMAL LOW (ref 22–32)
Calcium: 9.1 mg/dL (ref 8.9–10.3)
Chloride: 107 mmol/L (ref 98–111)
Creatinine, Ser: 0.6 mg/dL (ref 0.44–1.00)
GFR, Estimated: >60 mL/min (ref >60)
Glucose, Bld: 70 mg/dL (ref 70–99)
Potassium: 4.8 mmol/L (ref 3.5–5.1)
Sodium: 137 mmol/L (ref 135–145)
Total Bilirubin: 1.6 mg/dL — ABNORMAL HIGH (ref 0.0–1.2)
Total Protein: 5.6 g/dL — ABNORMAL LOW (ref 6.5–8.1)

## 2024-02-06 LAB — CBC
HCT: 38.4 % (ref 36.0–46.0)
Hemoglobin: 13.1 g/dL (ref 12.0–15.0)
MCH: 30 pg (ref 26.0–34.0)
MCHC: 34.1 g/dL (ref 30.0–36.0)
MCV: 87.9 fL (ref 80.0–100.0)
Platelets: 213 K/uL (ref 150–400)
RBC: 4.37 MIL/uL (ref 3.87–5.11)
RDW: 13.2 % (ref 11.5–15.5)
WBC: 10.5 K/uL (ref 4.0–10.5)
nRBC: 0 % (ref 0.0–0.2)

## 2024-02-06 LAB — PROTEIN / CREATININE RATIO, URINE
Creatinine, Urine: 41 mg/dL
Protein Creatinine Ratio: 0.34 mg/mg{creat} — ABNORMAL HIGH (ref 0.00–0.15)
Total Protein, Urine: 14 mg/dL

## 2024-02-06 LAB — RPR: RPR Ser Ql: NONREACTIVE

## 2024-02-06 MED ORDER — LABETALOL HCL 5 MG/ML IV SOLN: 20.0000 mg | INTRAVENOUS | Status: DC | PRN

## 2024-02-06 MED ORDER — PHENYLEPHRINE 80 MCG/ML (10ML) SYRINGE FOR IV PUSH (FOR BLOOD PRESSURE SUPPORT): 80.0000 ug | PREFILLED_SYRINGE | INTRAVENOUS | Status: DC | PRN

## 2024-02-06 MED ORDER — NIFEDIPINE ER OSMOTIC RELEASE 30 MG PO TB24
30.0000 mg | ORAL_TABLET | Freq: Every day | ORAL | Status: DC
  Administered 2024-02-06: 30 mg via ORAL
  Filled 2024-02-06 (×2): qty 1

## 2024-02-06 MED ORDER — LIDOCAINE-EPINEPHRINE (PF) 2 %-1:200000 IJ SOLN
INTRAMUSCULAR | Status: DC | PRN
  Administered 2024-02-06: 3 mL via EPIDURAL

## 2024-02-06 MED ORDER — ACETAMINOPHEN 325 MG PO TABS
650.0000 mg | ORAL_TABLET | ORAL | Status: DC | PRN
  Administered 2024-02-07: 650 mg via ORAL
  Filled 2024-02-06: qty 2

## 2024-02-06 MED ORDER — LABETALOL HCL 5 MG/ML IV SOLN: 80.0000 mg | INTRAVENOUS | Status: DC | PRN

## 2024-02-06 MED ORDER — LABETALOL HCL 5 MG/ML IV SOLN: 40.0000 mg | INTRAVENOUS | Status: DC | PRN

## 2024-02-06 MED ORDER — DIPHENHYDRAMINE HCL 25 MG PO CAPS: 25.0000 mg | ORAL_CAPSULE | Freq: Four times a day (QID) | ORAL | Status: DC | PRN

## 2024-02-06 MED ORDER — HYDRALAZINE HCL 20 MG/ML IJ SOLN: 10.0000 mg | INTRAMUSCULAR | Status: DC | PRN

## 2024-02-06 MED ORDER — EPHEDRINE 5 MG/ML INJ: 10.0000 mg | INTRAVENOUS | Status: DC | PRN

## 2024-02-06 MED ORDER — COCONUT OIL OIL
1.0000 | TOPICAL_OIL | Status: DC | PRN
Start: 2024-02-06 — End: 2024-02-08

## 2024-02-06 MED ORDER — LACTATED RINGERS IV SOLN
500.0000 mL | Freq: Once | INTRAVENOUS | Status: AC
  Administered 2024-02-06: 500 mL via INTRAVENOUS

## 2024-02-06 MED ORDER — ONDANSETRON HCL 4 MG PO TABS: 4.0000 mg | ORAL_TABLET | ORAL | Status: DC | PRN

## 2024-02-06 MED ORDER — LABETALOL HCL 100 MG PO TABS
100.0000 mg | ORAL_TABLET | Freq: Once | ORAL | Status: AC
  Administered 2024-02-06: 100 mg via ORAL
  Filled 2024-02-06: qty 1

## 2024-02-06 MED ORDER — FENTANYL-BUPIVACAINE-NACL 0.5-0.125-0.9 MG/250ML-% EP SOLN
EPIDURAL | Status: AC
  Filled 2024-02-06: qty 250

## 2024-02-06 MED ORDER — IBUPROFEN 600 MG PO TABS
600.0000 mg | ORAL_TABLET | Freq: Four times a day (QID) | ORAL | Status: DC
  Administered 2024-02-06 – 2024-02-08 (×7): 600 mg via ORAL
  Filled 2024-02-06 (×8): qty 1

## 2024-02-06 MED ORDER — PHENYLEPHRINE 80 MCG/ML (10ML) SYRINGE FOR IV PUSH (FOR BLOOD PRESSURE SUPPORT)
PREFILLED_SYRINGE | INTRAVENOUS | Status: AC
  Filled 2024-02-06: qty 10

## 2024-02-06 MED ORDER — ONDANSETRON HCL 4 MG/2ML IJ SOLN: 4.0000 mg | INTRAMUSCULAR | Status: DC | PRN

## 2024-02-06 MED ORDER — FENTANYL-BUPIVACAINE-NACL 0.5-0.125-0.9 MG/250ML-% EP SOLN
12.0000 mL/h | EPIDURAL | Status: DC | PRN
  Administered 2024-02-06: 12 mL/h via EPIDURAL

## 2024-02-06 MED ORDER — DIPHENHYDRAMINE HCL 50 MG/ML IJ SOLN: 12.5000 mg | INTRAMUSCULAR | Status: DC | PRN

## 2024-02-06 MED ORDER — WITCH HAZEL-GLYCERIN EX PADS: 1.0000 | MEDICATED_PAD | CUTANEOUS | Status: DC | PRN

## 2024-02-06 MED ORDER — OXYCODONE HCL 5 MG PO TABS: 5.0000 mg | ORAL_TABLET | ORAL | Status: DC | PRN

## 2024-02-06 MED ORDER — OXYCODONE HCL 5 MG PO TABS: 10.0000 mg | ORAL_TABLET | ORAL | Status: DC | PRN

## 2024-02-06 MED ORDER — SIMETHICONE 80 MG PO CHEW: 80.0000 mg | CHEWABLE_TABLET | ORAL | Status: DC | PRN

## 2024-02-06 MED ORDER — LIDOCAINE HCL (PF) 1 % IJ SOLN
INTRAMUSCULAR | Status: DC | PRN
  Administered 2024-02-06: 3 mL via SUBCUTANEOUS

## 2024-02-06 MED ORDER — SENNOSIDES-DOCUSATE SODIUM 8.6-50 MG PO TABS
2.0000 | ORAL_TABLET | ORAL | Status: DC
  Administered 2024-02-06 – 2024-02-07 (×2): 2 via ORAL
  Filled 2024-02-06 (×3): qty 2

## 2024-02-06 MED ORDER — DIBUCAINE (PERIANAL) 1 % EX OINT: 1.0000 | TOPICAL_OINTMENT | CUTANEOUS | Status: DC | PRN

## 2024-02-06 MED ORDER — PRENATAL MULTIVITAMIN CH
1.0000 | ORAL_TABLET | Freq: Every day | ORAL | Status: DC
  Administered 2024-02-06 – 2024-02-07 (×2): 1 via ORAL
  Filled 2024-02-06 (×2): qty 1

## 2024-02-06 MED ORDER — NIFEDIPINE ER OSMOTIC RELEASE 30 MG PO TB24
30.0000 mg | ORAL_TABLET | Freq: Once | ORAL | Status: AC
  Administered 2024-02-06: 30 mg via ORAL

## 2024-02-06 MED ORDER — SODIUM CHLORIDE 0.9 % IV SOLN
INTRAVENOUS | Status: DC | PRN
  Administered 2024-02-06: 7 mL via EPIDURAL

## 2024-02-06 MED ORDER — TETANUS-DIPHTH-ACELL PERTUSSIS 5-2.5-18.5 LF-MCG/0.5 IM SUSY: 0.5000 mL | PREFILLED_SYRINGE | Freq: Once | INTRAMUSCULAR | Status: DC

## 2024-02-06 MED ORDER — BENZOCAINE-MENTHOL 20-0.5 % EX AERO
1.0000 | INHALATION_SPRAY | CUTANEOUS | Status: DC | PRN
Start: 2024-02-06 — End: 2024-02-08
  Filled 2024-02-06: qty 56

## 2024-02-06 NOTE — Progress Notes (Signed)
 Dr. Delana notified of patient's BP at 1st 4 hour check, 147/100. Verbally ordered 30mg  procardia  to be given now, with parameters to call for BP 160/110.

## 2024-02-06 NOTE — Lactation Note (Signed)
 This note was copied from a baby's chart. Lactation Consultation Note  Patient Name: Diamond Smith Date: 02/06/2024 Age:28 Reason for consult: Initial assessment;Exclusive pumping and bottle feeding;Early term 37-38.6wks;Infant < 6lbs;RN request  P3- MOB did not want to see the lactation team when admitted to the hospital, but the RN informed the [facility] team that she is needing assistance. MOB informed LC that she plans to exclusively pump for this child. MOB reports trying to latch him, but he would not latch. MOB requested a nipple shield to try and reports using it with her other children. LC educated MOB on nipple shield use, but she still wanted it. LC sized her at a 24 mm shield. MOB has a hands free pump to use, so LC encouraged her to use our hospital pump instead. LC set up the hospital DEBP with size 21 mm flanges. LC reviewed how to use the pump and how to clean it. MOB did not want to pump because she had visitors.  MOB requested to not been seen by the [facility] team again unless she requests it. LC provided MOB with coconut oil as requested. LC reviewed pace feeding, feeding infant on cue 8-12x in 24 hrs, not allowing infant to go over 3 hrs without a feeding, CDC milk storage guidelines, LC services handout and engorgement/breast care. LC encouraged MOB to call for further assistance as needed.  Maternal Data Has patient been taught Hand Expression?: No Does the patient have breastfeeding experience prior to this delivery?: Yes How long did the patient breastfeed?: 2 years of exclusive pumping with her first child and 2-2.5 years of exclusively breast feeding with her second child  Feeding Mother's Current Feeding Choice: Breast Milk and Formula Nipple Type: Slow - flow  Lactation Tools Discussed/Used Tools: Pump;Flanges;Coconut oil;Nipple Shields Nipple shield size: 24 (MOB request and education provided) Flange Size: 21 Breast pump type: Double-Electric Breast  Pump;Manual Pump Education: Setup, frequency, and cleaning;Milk Storage Reason for Pumping: exclusive pumping Pumping frequency: 15-20 min every 3 hrs  Interventions Interventions: Breast feeding basics reviewed;Coconut oil;Hand pump;DEBP;Education;Pace feeding;LC Services brochure  Discharge Discharge Education: Engorgement and breast care;Warning signs for feeding baby Pump: Hands Free;Personal;Advised to call insurance company  Consult Status Consult Status: Complete (mother declined follow up) Date: 02/06/24    Recardo Hoit BS, IBCLC 02/06/2024, 2:50 PM

## 2024-02-06 NOTE — Progress Notes (Signed)
 Some increased discomfort with contractions  BP 126/75   Pulse 68   Temp 98.2 F (36.8 C) (Oral)   Resp 20   Ht 5' 1 (1.549 m)   Wt 89.8 kg   BMI 37.41 kg/m   Toco: q3-4 minutes EFM: 130s, moderate variability SVE: 3/50/-3  A/P: 28 y.o. H6E7997 [redacted]w[redacted]d presents with gestational hypertension IOL: Unable to AROM due to fetal station and patient discomfort with examine.  Continue pitocin  and AROM when able.  GHTN: BPs normal to mild range.  Labs normal. Will monitor closely for progression to severe disease GBS positive: penicillin 

## 2024-02-06 NOTE — Progress Notes (Signed)
 Dr. Delana notified face-to-face of patient's high BPs on admission and 1 hour check. Ordered to check BP again at 1230 and call if higher than 140/90. No new orders at this time.

## 2024-02-06 NOTE — Anesthesia Postprocedure Evaluation (Signed)
 Anesthesia Post Note  Patient: Diamond Smith  Procedure(s) Performed: AN AD HOC LABOR EPIDURAL     Patient location during evaluation: Mother Baby Anesthesia Type: Epidural Level of consciousness: awake and alert Pain management: pain level controlled Vital Signs Assessment: post-procedure vital signs reviewed and stable Respiratory status: spontaneous breathing, nonlabored ventilation and respiratory function stable Cardiovascular status: stable Postop Assessment: no headache, no backache and epidural receding Anesthetic complications: no   No notable events documented.  Last Vitals:  Vitals:   02/06/24 0945 02/06/24 1237  BP: (!) 139/96 (!) 147/100  Pulse: 89 99  Resp: 16 16  Temp: 37.2 C 36.8 C  SpO2: 99% 99%    Last Pain:  Vitals:   02/06/24 1237  TempSrc: Oral  PainSc: 0-No pain   Pain Goal:                   Diamond Smith

## 2024-02-06 NOTE — Progress Notes (Signed)
 Post Partum Day 0 Subjective: no complaints, up ad lib, voiding, tolerating PO, + flatus, and lochia moderate. She denies fever, chills, CP or HA. She admits to fatigue. They do not plan on circumcision for baby   Objective: Blood pressure (!) 140/92, pulse 87, temperature 98.7 F (37.1 C), temperature source Oral, resp. rate 18, height 5' 1 (1.549 m), weight 89.8 kg, SpO2 99%, unknown if currently breastfeeding.  Physical Exam:  General: alert, cooperative, fatigued, and no distress Lochia: appropriate Uterine Fundus: firm Incision: n/a DVT Evaluation: No evidence of DVT seen on physical exam.  Recent Labs    02/05/24 2106 02/06/24 0418  HGB 12.6 13.1  HCT 37.5 38.4    Assessment/Plan: Plan for discharge tomorrow Routine pp care   LOS: 1 day   Marcellous Snarski W Tahiri Shareef, DO 02/06/2024, 9:39 AM

## 2024-02-06 NOTE — Progress Notes (Signed)
 Per Dr. Delana, give another 30mg  procardia  now, OK to transfer to [facility] with no labetalol  given.

## 2024-02-06 NOTE — Progress Notes (Addendum)
 Patient ID: Diamond Smith, female   DOB: 05/22/1996, 28 y.o.   MRN: [id number] Pts BP improved slightly after second dose of procardia  30xl given but still elevated : 140s/93  Pt reports had a HA earlier that resolved. None at this time  ALT 23 AST 45 ( elev)  Upr/cr 0.34  A: 28yo G3P2002 on PPD 0 following svd with gestational hypertension   Plan: BP not adequately controlled following two separate doses of 30xl procardia  thus will add labetalol  100mg  now and recheck BP in 2 hrs.   If BP increases into severe range, will start on magnesium sulfate  If BP better controlled will adjust dose and timing of medication to be administered tomorrow   Repeat cbc and cmp in am

## 2024-02-06 NOTE — Progress Notes (Signed)
 Breathing through contractions  BP (!) 148/92   Pulse 90   Temp 98.2 F (36.8 C) (Oral)   Resp 20   Ht 5' 1 (1.549 m)   Wt 89.8 kg   BMI 37.41 kg/m    Toco: q2-3 minutes EFM: 140s, moderate variability, category 1 SVE: 4/50/-2, AROM clear fluid  A/P: [id number] [redacted]w[redacted]d presents with gestational hypertension IOL: Unable to AROM due to fetal station and patient discomfort with examine.  Continue pitocin  and AROM when able.  GHTN: BPs normal to mild range.  Labs normal. Will monitor closely for progression to severe disease GBS positive: penicillin 

## 2024-02-06 NOTE — Progress Notes (Signed)
 Patient ID: Diamond Smith, female   DOB: 1996-06-14, 28 y.o.   MRN: [id number] BP steadily increased postpartum  Pt remained asymptomatic  Started her on procardia  30xl and set parameters to call OBGYN. Pt also to be given labetalol  protocol if BP increases to severe range   Recheck BP 4 hrs later was further elevated into 156/104  I advised a second dose of procardia  30xl, then checked cmp and upr/cr.   I also advised pt transfer to [facility] specialty care unit in anticipation of possible need for MgS04

## 2024-02-06 NOTE — Anesthesia Preprocedure Evaluation (Signed)
 Anesthesia Evaluation  Patient identified by MRN, date of birth, ID band Patient awake    Reviewed: Allergy & Precautions, NPO status , Patient's Chart, lab work & pertinent test results  History of Anesthesia Complications Negative for: history of anesthetic complications  Airway Mallampati: II  TM Distance: >3 FB Neck ROM: Full    Dental no notable dental hx. (+) Teeth Intact   Pulmonary neg pulmonary ROS, neg sleep apnea, neg COPD, Patient abstained from smoking.Not current smoker   Pulmonary exam normal breath sounds clear to auscultation       Cardiovascular Exercise Tolerance: Good METShypertension, (-) CAD and (-) Past MI (-) dysrhythmias  Rhythm:Regular Rate:Normal - Systolic murmurs    Neuro/Psych negative neurological ROS  negative psych ROS   GI/Hepatic ,GERD  ,,(+)     (-) substance abuse    Endo/Other  neg diabetes    Renal/GU negative Renal ROS     Musculoskeletal   Abdominal   Peds  Hematology Denies blood thinner use or bleeding disorders.    Anesthesia Other Findings Denies blood thinner use or bleeding diatheses. Recent labs reviewed. Past Medical History: No date: GERD (gastroesophageal reflux disease) No date: Pregnancy induced hypertension   Reproductive/Obstetrics (+) Pregnancy                              Anesthesia Physical Anesthesia Plan  ASA: 2  Anesthesia Plan: Epidural   Post-op Pain Management:    Induction:   PONV Risk Score and Plan: 2 and Treatment may vary due to age or medical condition and Ondansetron   Airway Management Planned: Natural Airway  Additional Equipment:   Intra-op Plan:   Post-operative Plan:   Informed Consent: I have reviewed the patients History and Physical, chart, labs and discussed the procedure including the risks, benefits and alternatives for the proposed anesthesia with the patient or authorized representative  who has indicated his/her understanding and acceptance.       Plan Discussed with: Surgeon  Anesthesia Plan Comments: (Discussed R/B/A of neuraxial anesthesia technique with patient: - rare risks of spinal/epidural hematoma, nerve damage, infection - Risk of PDPH - Risk of itching - Risk of nausea and vomiting - Risk of poor block necessitating replacement of epidural. - Risk of allergic reactions. Patient voiced understanding.)        Anesthesia Quick Evaluation

## 2024-02-06 NOTE — Anesthesia Procedure Notes (Signed)
 Epidural Patient location during procedure: OB Start time: 02/06/2024 4:45 AM End time: 02/06/2024 5:00 AM  Staffing Anesthesiologist: Boone Fess, MD Performed: anesthesiologist   Preanesthetic Checklist Completed: patient identified, IV checked, site marked, risks and benefits discussed, surgical consent, monitors and equipment checked, pre-op evaluation and timeout performed  Epidural Patient position: sitting Prep: ChloraPrep Patient monitoring: heart rate, continuous pulse ox and blood pressure Approach: midline Location: L3-L4 Injection technique: LOR saline  Needle:  Needle type: Tuohy  Needle gauge: 17 G Needle length: 9 cm Needle insertion depth: 7 cm Catheter type: closed end flexible Catheter size: 19 Gauge Catheter at skin depth: 12 cm Test dose: negative and 1.5% lidocaine  with Epi 1:200 K  Assessment Sensory level: T10 Events: blood not aspirated, no cerebrospinal fluid, injection not painful, no injection resistance, no paresthesia and negative IV test  Additional Notes First/one attempt Pt. Evaluated and documentation done after procedure finished. Patient identified. Risks/Benefits/Options discussed with patient including but not limited to bleeding, infection, nerve damage, paralysis, failed block, incomplete pain control, headache, blood pressure changes, nausea, vomiting, reactions to medication both or allergic, itching and postpartum back pain. Confirmed with bedside nurse the patient's most recent platelet count. Confirmed with patient that they are not currently taking any anticoagulation, have any bleeding history or any family history of bleeding disorders. Patient expressed understanding and wished to proceed. All questions were answered. Sterile technique was used throughout the entire procedure. Please see nursing notes for vital signs. Test dose was given through epidural catheter and negative prior to continuing to dose epidural or start infusion.  Warning signs of high block given to the patient including shortness of breath, tingling/numbness in hands, complete motor block, or any concerning symptoms with instructions to call for help. Patient was given instructions on fall risk and not to get out of bed. All questions and concerns addressed with instructions to call with any issues or inadequate analgesia.     Patient tolerated the insertion well without immediate complications.  Reason for block: procedure for painReason for block:procedure for pain

## 2024-02-07 LAB — COMPREHENSIVE METABOLIC PANEL WITH GFR
ALT: 26 U/L (ref 0–44)
ALT: 30 U/L (ref 0–44)
AST: 39 U/L (ref 15–41)
AST: 47 U/L — ABNORMAL HIGH (ref 15–41)
Albumin: 2.2 g/dL — ABNORMAL LOW (ref 3.5–5.0)
Albumin: 2.7 g/dL — ABNORMAL LOW (ref 3.5–5.0)
Alkaline Phosphatase: 134 U/L — ABNORMAL HIGH (ref 38–126)
Alkaline Phosphatase: 158 U/L — ABNORMAL HIGH (ref 38–126)
Anion gap: 13 (ref 5–15)
Anion gap: 9 (ref 5–15)
BUN: 7 mg/dL (ref 6–20)
BUN: 5 mg/dL — ABNORMAL LOW (ref 6–20)
CO2: 21 mmol/L — ABNORMAL LOW (ref 22–32)
CO2: 21 mmol/L — ABNORMAL LOW (ref 22–32)
Calcium: 8.7 mg/dL — ABNORMAL LOW (ref 8.9–10.3)
Calcium: 8.9 mg/dL (ref 8.9–10.3)
Chloride: 104 mmol/L (ref 98–111)
Chloride: 106 mmol/L (ref 98–111)
Creatinine, Ser: 0.68 mg/dL (ref 0.44–1.00)
Creatinine, Ser: 0.65 mg/dL (ref 0.44–1.00)
GFR, Estimated: >60 mL/min (ref >60)
GFR, Estimated: >60 mL/min (ref >60)
Glucose, Bld: 111 mg/dL — ABNORMAL HIGH (ref 70–99)
Glucose, Bld: 116 mg/dL — ABNORMAL HIGH (ref 70–99)
Potassium: 3.8 mmol/L (ref 3.5–5.1)
Potassium: 3.8 mmol/L (ref 3.5–5.1)
Sodium: 138 mmol/L (ref 135–145)
Sodium: 136 mmol/L (ref 135–145)
Total Bilirubin: 0.8 mg/dL (ref 0.0–1.2)
Total Bilirubin: 0.7 mg/dL (ref 0.0–1.2)
Total Protein: 5.3 g/dL — ABNORMAL LOW (ref 6.5–8.1)
Total Protein: 6.2 g/dL — ABNORMAL LOW (ref 6.5–8.1)

## 2024-02-07 LAB — CBC WITH DIFFERENTIAL/PLATELET
Abs Immature Granulocytes: 0.04 K/uL (ref 0.00–0.07)
Abs Immature Granulocytes: 0.05 K/uL (ref 0.00–0.07)
Basophils Absolute: 0 K/uL (ref 0.0–0.1)
Basophils Absolute: 0 K/uL (ref 0.0–0.1)
Basophils Relative: 0 %
Basophils Relative: 0 %
Eosinophils Absolute: 0.1 K/uL (ref 0.0–0.5)
Eosinophils Absolute: 0 K/uL (ref 0.0–0.5)
Eosinophils Relative: 1 %
Eosinophils Relative: 0 %
HCT: 31.9 % — ABNORMAL LOW (ref 36.0–46.0)
HCT: 35.3 % — ABNORMAL LOW (ref 36.0–46.0)
Hemoglobin: 10.7 g/dL — ABNORMAL LOW (ref 12.0–15.0)
Hemoglobin: 11.6 g/dL — ABNORMAL LOW (ref 12.0–15.0)
Immature Granulocytes: 0 %
Immature Granulocytes: 1 %
Lymphocytes Relative: 23 %
Lymphocytes Relative: 20 %
Lymphs Abs: 2.2 K/uL (ref 0.7–4.0)
Lymphs Abs: 1.8 K/uL (ref 0.7–4.0)
MCH: 30.2 pg (ref 26.0–34.0)
MCH: 29.5 pg (ref 26.0–34.0)
MCHC: 33.5 g/dL (ref 30.0–36.0)
MCHC: 32.9 g/dL (ref 30.0–36.0)
MCV: 90.1 fL (ref 80.0–100.0)
MCV: 89.8 fL (ref 80.0–100.0)
Monocytes Absolute: 0.6 K/uL (ref 0.1–1.0)
Monocytes Absolute: 0.5 K/uL (ref 0.1–1.0)
Monocytes Relative: 7 %
Monocytes Relative: 5 %
Neutro Abs: 6.4 K/uL (ref 1.7–7.7)
Neutro Abs: 6.6 K/uL (ref 1.7–7.7)
Neutrophils Relative %: 69 %
Neutrophils Relative %: 74 %
Platelets: 187 K/uL (ref 150–400)
Platelets: 217 K/uL (ref 150–400)
RBC: 3.54 MIL/uL — ABNORMAL LOW (ref 3.87–5.11)
RBC: 3.93 MIL/uL (ref 3.87–5.11)
RDW: 13.4 % (ref 11.5–15.5)
RDW: 13.6 % (ref 11.5–15.5)
WBC: 9.3 K/uL (ref 4.0–10.5)
WBC: 9 K/uL (ref 4.0–10.5)
nRBC: 0 % (ref 0.0–0.2)
nRBC: 0 % (ref 0.0–0.2)

## 2024-02-07 MED ORDER — NIFEDIPINE ER OSMOTIC RELEASE 30 MG PO TB24
30.0000 mg | ORAL_TABLET | Freq: Two times a day (BID) | ORAL | Status: DC
  Administered 2024-02-07 (×2): 30 mg via ORAL
  Filled 2024-02-07 (×3): qty 1

## 2024-02-07 MED ORDER — LABETALOL HCL 100 MG PO TABS
100.0000 mg | ORAL_TABLET | Freq: Two times a day (BID) | ORAL | Status: DC
  Administered 2024-02-07 (×2): 100 mg via ORAL
  Filled 2024-02-07 (×2): qty 1

## 2024-02-07 NOTE — Progress Notes (Signed)
 Patient ID: Diamond Smith, female   DOB: [birth year], 28 y.o.   MRN: [id number] BP 113/72 this am   Continue with plan as noted

## 2024-02-07 NOTE — Progress Notes (Signed)
 Post Partum Day 1 Subjective: no complaints, up ad lib, voiding, tolerating PO, + flatus, and lochia moderate. She denies fever, chills, CP or HA. They do not plan on circumcision for baby   Objective: Blood pressure 113/72, pulse 84, temperature 98 F (36.7 C), temperature source Oral, resp. rate 16, height 5' 1 (1.549 m), weight 89.8 kg, SpO2 100%, unknown if currently breastfeeding.  Physical Exam:  General: alert, cooperative, fatigued, and no distress Lochia: appropriate Uterine Fundus: firm Incision: n/a DVT Evaluation: No evidence of DVT seen on physical exam.  Recent Labs    02/06/24 0418 02/07/24 0429  HGB 13.1 10.7*  HCT 38.4 31.9*    Assessment/Plan: Plan for discharge tomorrow Routine pp care GHTN - now BP controlled on Procardia  30XL BID plus labetalol  100mg  BID. Monitor today, DC home today with BP check   LOS: 2 days   Lavonia CHRISTELLA Guppy, MD 02/07/2024, 9:00 AM

## 2024-02-07 NOTE — Plan of Care (Signed)
  Problem: Education: Goal: Knowledge of General Education information will improve Description: Including pain rating scale, medication(s)/side effects and non-pharmacologic comfort measures Outcome: Progressing   Problem: Health Behavior/Discharge Planning: Goal: Ability to manage health-related needs will improve Outcome: Progressing   Problem: Clinical Measurements: Goal: Ability to maintain clinical measurements within normal limits will improve Outcome: Progressing Goal: Will remain free from infection Outcome: Progressing Goal: Diagnostic test results will improve Outcome: Progressing Goal: Cardiovascular complication will be avoided Outcome: Progressing   Problem: Activity: Goal: Risk for activity intolerance will decrease Outcome: Progressing   Problem: Nutrition: Goal: Adequate nutrition will be maintained Outcome: Progressing   Problem: Coping: Goal: Level of anxiety will decrease Outcome: Progressing   Problem: Elimination: Goal: Will not experience complications related to bowel motility Outcome: Progressing Goal: Will not experience complications related to urinary retention Outcome: Progressing   Problem: Pain Managment: Goal: General experience of comfort will improve and/or be controlled Outcome: Progressing   Problem: Safety: Goal: Ability to remain free from injury will improve Outcome: Progressing   Problem: Skin Integrity: Goal: Risk for impaired skin integrity will decrease Outcome: Progressing   Problem: Activity: Goal: Will verbalize the importance of balancing activity with adequate rest periods Outcome: Progressing Goal: Ability to tolerate increased activity will improve Outcome: Progressing   Problem: Coping: Goal: Ability to identify and utilize available resources and services will improve Outcome: Progressing   Problem: Life Cycle: Goal: Chance of risk for complications during the postpartum period will decrease Outcome:  Progressing   Problem: Skin Integrity: Goal: Demonstration of wound healing without infection will improve Outcome: Progressing   Problem: Education: Goal: Knowledge of disease or condition will improve Outcome: Progressing Goal: Knowledge of the prescribed therapeutic regimen will improve Outcome: Progressing   Problem: Fluid Volume: Goal: Peripheral tissue perfusion will improve Outcome: Progressing   Problem: Clinical Measurements: Goal: Complications related to disease process, condition or treatment will be avoided or minimized Outcome: Progressing

## 2024-02-07 NOTE — Progress Notes (Signed)
 141/92 BP at 1531, repeat 132/86. Transient RUQ with first BP. At this time, deneis RUQ pain, AH, SOB, N/V.   BP 132/86 (BP Location: Right Arm)   Pulse 85   Temp 98 F (36.7 C) (Oral)   Resp 18   Ht 5' 1 (1.549 m)   Wt 89.8 kg   SpO2 100%   BMI 37.41 kg/m  Resting in bed with baby CTAB,RRR Soft, NTTP, BS x4 Neg calf edema, Homans BL  CBC stable, CMP shows AST?ALT 47/30. Trend has been 45/23 > 39/26 > now 47/30. Cr 0.6 > 0.68 > 0.65.  Repeat labs in AM, currently normotensive. Reviewed PreE signs with patient. Repeat labs planned again in AM to trend. Aware of 1wk BP check postpartum

## 2024-02-08 ENCOUNTER — Inpatient Hospital Stay (HOSPITAL_COMMUNITY)

## 2024-02-08 ENCOUNTER — Other Ambulatory Visit (HOSPITAL_COMMUNITY): Payer: Self-pay

## 2024-02-08 LAB — COMPREHENSIVE METABOLIC PANEL WITH GFR
ALT: 26 U/L (ref 0–44)
AST: 41 U/L (ref 15–41)
Albumin: 2.4 g/dL — ABNORMAL LOW (ref 3.5–5.0)
Alkaline Phosphatase: 118 U/L (ref 38–126)
Anion gap: 12 (ref 5–15)
BUN: 9 mg/dL (ref 6–20)
CO2: 21 mmol/L — ABNORMAL LOW (ref 22–32)
Calcium: 8.9 mg/dL (ref 8.9–10.3)
Chloride: 105 mmol/L (ref 98–111)
Creatinine, Ser: 0.5 mg/dL (ref 0.44–1.00)
GFR, Estimated: >60 mL/min (ref >60)
Glucose, Bld: 83 mg/dL (ref 70–99)
Potassium: 3.8 mmol/L (ref 3.5–5.1)
Sodium: 138 mmol/L (ref 135–145)
Total Bilirubin: 0.7 mg/dL (ref 0.0–1.2)
Total Protein: 5.5 g/dL — ABNORMAL LOW (ref 6.5–8.1)

## 2024-02-08 LAB — CBC WITH DIFFERENTIAL/PLATELET
Abs Immature Granulocytes: 0.07 K/uL (ref 0.00–0.07)
Basophils Absolute: 0 K/uL (ref 0.0–0.1)
Basophils Relative: 0 %
Eosinophils Absolute: 0.1 K/uL (ref 0.0–0.5)
Eosinophils Relative: 1 %
HCT: 34.4 % — ABNORMAL LOW (ref 36.0–46.0)
Hemoglobin: 11.5 g/dL — ABNORMAL LOW (ref 12.0–15.0)
Immature Granulocytes: 1 %
Lymphocytes Relative: 31 %
Lymphs Abs: 2.5 K/uL (ref 0.7–4.0)
MCH: 29.9 pg (ref 26.0–34.0)
MCHC: 33.4 g/dL (ref 30.0–36.0)
MCV: 89.6 fL (ref 80.0–100.0)
Monocytes Absolute: 0.5 K/uL (ref 0.1–1.0)
Monocytes Relative: 7 %
Neutro Abs: 4.8 K/uL (ref 1.7–7.7)
Neutrophils Relative %: 60 %
Platelets: 200 K/uL (ref 150–400)
RBC: 3.84 MIL/uL — ABNORMAL LOW (ref 3.87–5.11)
RDW: 13.7 % (ref 11.5–15.5)
WBC: 7.9 K/uL (ref 4.0–10.5)
nRBC: 0 % (ref 0.0–0.2)

## 2024-02-08 MED ORDER — LABETALOL HCL 100 MG PO TABS
100.0000 mg | ORAL_TABLET | Freq: Three times a day (TID) | ORAL | Status: DC
  Administered 2024-02-08: 100 mg via ORAL
  Filled 2024-02-08: qty 1

## 2024-02-08 MED ORDER — ACETAMINOPHEN 325 MG PO TABS: 650.0000 mg | ORAL_TABLET | Freq: Four times a day (QID) | ORAL | Status: AC | PRN

## 2024-02-08 MED ORDER — LABETALOL HCL 100 MG PO TABS
100.0000 mg | ORAL_TABLET | Freq: Three times a day (TID) | ORAL | 0 refills | Status: DC
  Filled 2024-02-08: qty 90, 30d supply, fill #0

## 2024-02-08 MED ORDER — NIFEDIPINE ER 30 MG PO TB24
30.0000 mg | ORAL_TABLET | Freq: Two times a day (BID) | ORAL | 0 refills | Status: DC
  Filled 2024-02-08: qty 60, 30d supply, fill #0

## 2024-02-08 MED ORDER — IBUPROFEN 200 MG PO TABS: 600.0000 mg | ORAL_TABLET | Freq: Four times a day (QID) | ORAL | Status: AC | PRN

## 2024-02-08 NOTE — Progress Notes (Signed)
 Post Partum Day 2 Subjective: Patient is doing well this morning. Pain is controlled. Ambulating, voiding, tolerating PO. Minimal lochia. Breastfeeding.   Objective: Patient Vitals for the past 24 hrs:  BP Temp Temp src Pulse Resp SpO2  02/08/24 0821 (!) 146/88 98.2 F (36.8 C) Oral 84 18 100 %  02/08/24 0555 134/88 98.2 F (36.8 C) Oral 74 16 100 %  02/08/24 0300 122/76 98 F (36.7 C) Oral 84 16 100 %  02/07/24 2342 136/82 -- -- 79 -- --  02/07/24 2234 (!) 153/95 -- -- 90 -- --  02/07/24 2031 (!) 149/92 98.1 F (36.7 C) -- 87 16 100 %  02/07/24 1649 132/86 -- -- 85 -- --  02/07/24 1541 (!) 141/92 98 F (36.7 C) Oral 86 18 100 %  02/07/24 1245 130/72 98.1 F (36.7 C) Oral 87 16 99 %  02/07/24 1001 129/78 97.7 F (36.5 C) Oral 86 17 100 %    Physical Exam:  General: alert, cooperative, and no distress Lochia: appropriate Uterine Fundus: firm DVT Evaluation: No evidence of DVT seen on physical exam.  Recent Labs    02/06/24 0418 02/07/24 0429 02/07/24 1606 02/08/24 0522  WBC 10.5 9.3 9.0 7.9  HGB 13.1 10.7* 11.6* 11.5*  HCT 38.4 31.9* 35.3* 34.4*  PLT 213 187 217 200    Recent Labs    02/06/24 1753 02/07/24 0429 02/07/24 1606 02/08/24 0522  NA 137 138 136 138  K 4.8 3.8 3.8 3.8  CL 107 104 106 105  BUN 7 7 5* 9  CREATININE 0.60 0.68 0.65 0.50  GLUCOSE 70 111* 116* 83  BILITOT 1.6* 0.8 0.7 0.7  ALT 23 26 30 26   AST 45* 39 47* 41  ALKPHOS 150* 134* 158* 118  PROT 5.6* 5.3* 6.2* 5.5*  ALBUMIN 2.4* 2.2* 2.7* 2.4*    Recent Labs    02/06/24 1753 02/07/24 0429 02/07/24 1606 02/08/24 0522  CALCIUM 9.1 8.7* 8.9 8.9    No results for input(s): PROTIME, APTT, INR in the last 72 hours.  No results for input(s): PROTIME, APTT, INR, FIBRINOGEN in the last 72 hours. Assessment/Plan: Diamond Smith 28 y.o. G3P2002 PPD#2 sp SVD 1. PPC: routine PP care 2. GHTN: Bps mostly normotensive with few mild range blood pressures on Procardia  xl 30mg  BID  and labetalol  100mg  BID. Will increase to labetalol  100mg  TID at discharge and plan to follow up with BP check in office early next week. 3. Rh pos 4. Dispo: ready for discharge today, reviewed discharge instructions and preeclampsia precautions.    LOS: 3 days   Diamond Smith 02/08/2024, 8:58 AM  2

## 2024-02-08 NOTE — Discharge Summary (Signed)
 Postpartum Discharge Summary  Date of Service updated 02/08/24      Patient Name: Diamond Smith DOB: [birth year] MRN: [id number]  Date of admission: 02/05/2024 Delivery date:02/06/2024 Delivering provider: GRETTA GUMS Date of discharge: 02/08/2024  Admitting diagnosis: Gestational hypertension w/o significant proteinuria in 3rd trimester [O13.3] Intrauterine pregnancy: [redacted]w[redacted]d     Secondary diagnosis:  Principal Problem:   Gestational hypertension w/o significant proteinuria in 3rd trimester  Additional problems: none    Discharge diagnosis: Term Pregnancy Delivered and Gestational Hypertension                                              Post partum procedures:none Augmentation: AROM and Pitocin  Complications: None  Hospital course: Induction of Labor With Vaginal Delivery   28 y.o. yo G3P2002 at [redacted]w[redacted]d was admitted to the hospital 02/05/2024 for induction of labor.  Indication for induction: Gestational hypertension.  Patient had an labor course complicated by nothing Membrane Rupture Time/Date: 3:03 AM,02/06/2024  Delivery Method:Vaginal, Spontaneous Operative Delivery:N/A Episiotomy: None Lacerations:  None Details of delivery can be found in separate delivery note.  Patient had a postpartum course complicated by elevated blood pressures, antihypertensives were started and titrated up to Procardia  XL 30mg  BID and Labetalol  100mg  TID. Patient is discharged home 02/08/24.  Newborn Data: Birth date:02/06/2024 Birth time:6:49 AM Gender:Female Living status:Living Apgars:9 ,9  Weight:2710 g  Magnesium Sulfate received: No BMZ received: No Rhophylac:N/A MMR:N/A T-DaP:Given prenatally Flu: No RSV Vaccine received: No Transfusion:Yes Immunizations administered: Immunization History  Administered Date(s) Administered   Influenza Inj Mdck Quad Pf 04/25/2022   Influenza Inj Mdck Quad With Preservative 04/16/2018   Influenza,inj,Quad PF,6+ Mos 05/01/2016, 04/24/2019, 05/19/2021    Influenza-Unspecified 04/16/2017, 04/16/2018, 05/05/2022   PFIZER(Purple Top)SARS-COV-2 Vaccination 09/30/2019, 10/21/2019   Tdap 01/21/2015, 04/25/2019    Physical exam  Vitals:   02/07/24 2342 02/08/24 0300 02/08/24 0555 02/08/24 0821  BP: 136/82 122/76 134/88 (!) 146/88  Pulse: 79 84 74 84  Resp:  16 16 18   Temp:  98 F (36.7 C) 98.2 F (36.8 C) 98.2 F (36.8 C)  TempSrc:  Oral Oral Oral  SpO2:  100% 100% 100%  Weight:      Height:       General: alert, cooperative, and no distress Lochia: appropriate Uterine Fundus: firm Incision: N/A DVT Evaluation: No evidence of DVT seen on physical exam. Labs: Lab Results  Component Value Date   WBC 7.9 02/08/2024   HGB 11.5 (L) 02/08/2024   HCT 34.4 (L) 02/08/2024   MCV 89.6 02/08/2024   PLT 200 02/08/2024      Latest Ref Rng & Units 02/08/2024    5:22 AM  CMP  Glucose 70 - 99 mg/dL 83   BUN 6 - 20 mg/dL 9   Creatinine 9.55 - 8.99 mg/dL 9.49   Sodium 864 - 854 mmol/L 138   Potassium 3.5 - 5.1 mmol/L 3.8   Chloride 98 - 111 mmol/L 105   CO2 22 - 32 mmol/L 21   Calcium 8.9 - 10.3 mg/dL 8.9   Total Protein 6.5 - 8.1 g/dL 5.5   Total Bilirubin 0.0 - 1.2 mg/dL 0.7   Alkaline Phos 38 - 126 U/L 118   AST 15 - 41 U/L 41   ALT 0 - 44 U/L 26    Edinburgh Score:    02/07/2024    3:40  PM  Edinburgh Postnatal Depression Scale Screening Tool  I have been able to laugh and see the funny side of things. 0  I have looked forward with enjoyment to things. 0  I have blamed myself unnecessarily when things went wrong. 0  I have been anxious or worried for no good reason. 0  I have felt scared or panicky for no good reason. 0  Things have been getting on top of me. 0  I have been so unhappy that I have had difficulty sleeping. 0  I have felt sad or miserable. 0  I have been so unhappy that I have been crying. 0  The thought of harming myself has occurred to me. 0  Edinburgh Postnatal Depression Scale Total 0      After  visit meds:  Allergies as of 02/08/2024       Reactions   Covid-19 (mrna) Vaccine Hives        Medication List     STOP taking these medications    famotidine 40 MG tablet Commonly known as: PEPCID   Mirena (52 MG) 20 MCG/24HR Iud Generic drug: levonorgestrel   mometasone 0.1 % cream Commonly known as: ELOCON   triamcinolone cream 0.1 % Commonly known as: KENALOG       TAKE these medications    acetaminophen  325 MG tablet Commonly known as: Tylenol  Take 2 tablets (650 mg total) by mouth every 6 (six) hours as needed (for pain scale < 4).   cetirizine  10 MG tablet Commonly known as: ZYRTEC  Take 1 tablet (10 mg total) by mouth daily.   ibuprofen  200 MG tablet Commonly known as: ADVIL  Take 3 tablets (600 mg total) by mouth every 6 (six) hours as needed.   labetalol  100 MG tablet Commonly known as: NORMODYNE  Take 1 tablet (100 mg total) by mouth every 8 (eight) hours.   montelukast 10 MG tablet Commonly known as: SINGULAIR 1 tablet Orally Once a day for 90 days   Nasacort Allergy 24HR 55 MCG/ACT Aero nasal inhaler Generic drug: triamcinolone Place 2 sprays into the nose daily.   NIFEdipine  30 MG 24 hr tablet Commonly known as: ADALAT  CC Take 1 tablet (30 mg total) by mouth 2 (two) times daily.         Discharge home in stable condition Infant Feeding: Breast Infant Disposition:home with mother Discharge instruction: per After Visit Summary and Postpartum booklet. Activity: Advance as tolerated. Pelvic rest for 6 weeks.  Diet: routine diet Anticipated Birth Control: Unsure Postpartum Appointment:4 weeks Additional Postpartum F/U: BP check 1 week Future Appointments: Future Appointments  Date Time Provider Department Center  03/03/2024  6:45 AM [facility] SCHED ROOM [facility] None   Follow up Visit:  Follow-up Information     Ob/Gyn, Landy Stains. Schedule an appointment as soon as possible for a visit in 1 week(s).   Why: Blood pressure  check Contact information: [address] [place] [address] [phone]                     02/08/2024 Rosaline FORBES Chapel, MD

## 2024-02-11 DIAGNOSIS — O135 Gestational [pregnancy-induced] hypertension without significant proteinuria, complicating the puerperium: Secondary | ICD-10-CM | POA: Diagnosis not present

## 2024-02-14 DIAGNOSIS — R1011 Right upper quadrant pain: Secondary | ICD-10-CM | POA: Diagnosis not present

## 2024-02-15 ENCOUNTER — Other Ambulatory Visit: Payer: Self-pay | Admitting: Family Medicine

## 2024-02-15 ENCOUNTER — Other Ambulatory Visit: Payer: Self-pay

## 2024-02-15 ENCOUNTER — Inpatient Hospital Stay (HOSPITAL_COMMUNITY)
Admission: AD | Admit: 2024-02-15 | Discharge: 2024-02-18 | DRG: 776 | Disposition: A | Discharge status: Home / Self Care | Attending: Obstetrics and Gynecology | Admitting: Obstetrics and Gynecology

## 2024-02-15 ENCOUNTER — Encounter (HOSPITAL_COMMUNITY): Payer: Self-pay | Admitting: Obstetrics and Gynecology

## 2024-02-15 DIAGNOSIS — Z8249 Family history of ischemic heart disease and other diseases of the circulatory system: Secondary | ICD-10-CM | POA: Diagnosis not present

## 2024-02-15 DIAGNOSIS — O1495 Unspecified pre-eclampsia, complicating the puerperium: Secondary | ICD-10-CM | POA: Diagnosis not present

## 2024-02-15 DIAGNOSIS — L509 Urticaria, unspecified: Secondary | ICD-10-CM | POA: Diagnosis not present

## 2024-02-15 DIAGNOSIS — O1420 HELLP syndrome (HELLP), unspecified trimester: Principal | ICD-10-CM | POA: Diagnosis present

## 2024-02-15 DIAGNOSIS — O99285 Endocrine, nutritional and metabolic diseases complicating the puerperium: Secondary | ICD-10-CM | POA: Diagnosis present

## 2024-02-15 DIAGNOSIS — O1425 HELLP syndrome, complicating the puerperium: Secondary | ICD-10-CM | POA: Diagnosis not present

## 2024-02-15 LAB — COMPREHENSIVE METABOLIC PANEL WITH GFR
ALT: 112 U/L — ABNORMAL HIGH (ref 0–44)
AST: 123 U/L — ABNORMAL HIGH (ref 15–41)
Albumin: 3.5 g/dL (ref 3.5–5.0)
Alkaline Phosphatase: 122 U/L (ref 38–126)
Anion gap: 12 (ref 5–15)
BUN: 10 mg/dL (ref 6–20)
CO2: 23 mmol/L (ref 22–32)
Calcium: 9 mg/dL (ref 8.9–10.3)
Chloride: 103 mmol/L (ref 98–111)
Creatinine, Ser: 0.59 mg/dL (ref 0.44–1.00)
GFR, Estimated: >60 mL/min (ref >60)
Glucose, Bld: 87 mg/dL (ref 70–99)
Potassium: 4.3 mmol/L (ref 3.5–5.1)
Sodium: 138 mmol/L (ref 135–145)
Total Bilirubin: 1 mg/dL (ref 0.0–1.2)
Total Protein: 6.9 g/dL (ref 6.5–8.1)

## 2024-02-15 LAB — LACTATE DEHYDROGENASE: LDH: 183 U/L (ref 98–192)

## 2024-02-15 LAB — URIC ACID: Uric Acid, Serum: 8.5 mg/dL — ABNORMAL HIGH (ref 2.5–7.1)

## 2024-02-15 MED ORDER — COCONUT OIL OIL
1.0000 | TOPICAL_OIL | Status: DC | PRN
Start: 2024-02-15 — End: 2024-02-18

## 2024-02-15 MED ORDER — BENZOCAINE-MENTHOL 20-0.5 % EX AERO: 1.0000 | INHALATION_SPRAY | CUTANEOUS | Status: DC | PRN

## 2024-02-15 MED ORDER — SIMETHICONE 80 MG PO CHEW
80.0000 mg | CHEWABLE_TABLET | ORAL | Status: DC | PRN
Start: 2024-02-15 — End: 2024-02-18

## 2024-02-15 MED ORDER — MAGNESIUM SULFATE 40 GM/1000ML IV SOLN
2.0000 g/h | INTRAVENOUS | Status: AC
  Administered 2024-02-15 – 2024-02-16 (×2): 2 g/h via INTRAVENOUS
  Filled 2024-02-15 (×2): qty 1000

## 2024-02-15 MED ORDER — NIFEDIPINE ER OSMOTIC RELEASE 30 MG PO TB24: 30.0000 mg | ORAL_TABLET | Freq: Every day | ORAL | Status: DC

## 2024-02-15 MED ORDER — PRENATAL MULTIVITAMIN CH
1.0000 | ORAL_TABLET | Freq: Every day | ORAL | Status: DC
  Administered 2024-02-15 – 2024-02-18 (×4): 1 via ORAL
  Filled 2024-02-15 (×4): qty 1

## 2024-02-15 MED ORDER — LABETALOL HCL 5 MG/ML IV SOLN: 40.0000 mg | INTRAVENOUS | Status: DC | PRN

## 2024-02-15 MED ORDER — SENNOSIDES-DOCUSATE SODIUM 8.6-50 MG PO TABS
2.0000 | ORAL_TABLET | Freq: Every day | ORAL | Status: DC
  Administered 2024-02-16 – 2024-02-18 (×2): 2 via ORAL
  Filled 2024-02-15 (×3): qty 2

## 2024-02-15 MED ORDER — ONDANSETRON HCL 4 MG PO TABS
4.0000 mg | ORAL_TABLET | ORAL | Status: DC | PRN
Start: 2024-02-15 — End: 2024-02-18

## 2024-02-15 MED ORDER — IBUPROFEN 600 MG PO TABS
600.0000 mg | ORAL_TABLET | Freq: Four times a day (QID) | ORAL | Status: DC
  Administered 2024-02-15 – 2024-02-18 (×10): 600 mg via ORAL
  Filled 2024-02-15 (×12): qty 1

## 2024-02-15 MED ORDER — NIFEDIPINE ER OSMOTIC RELEASE 60 MG PO TB24: 60.0000 mg | ORAL_TABLET | Freq: Every morning | ORAL | Status: DC

## 2024-02-15 MED ORDER — NIFEDIPINE ER OSMOTIC RELEASE 60 MG PO TB24
60.0000 mg | ORAL_TABLET | Freq: Two times a day (BID) | ORAL | Status: DC
  Administered 2024-02-16 – 2024-02-17 (×2): 60 mg via ORAL
  Filled 2024-02-15 (×2): qty 1

## 2024-02-15 MED ORDER — LABETALOL HCL 5 MG/ML IV SOLN: 20.0000 mg | INTRAVENOUS | Status: DC | PRN

## 2024-02-15 MED ORDER — OXYCODONE HCL 5 MG PO TABS
5.0000 mg | ORAL_TABLET | ORAL | Status: DC | PRN
  Administered 2024-02-16: 5 mg via ORAL
  Filled 2024-02-15: qty 1

## 2024-02-15 MED ORDER — WITCH HAZEL-GLYCERIN EX PADS
1.0000 | MEDICATED_PAD | CUTANEOUS | Status: DC | PRN
Start: 2024-02-15 — End: 2024-02-18

## 2024-02-15 MED ORDER — LABETALOL HCL 100 MG PO TABS
100.0000 mg | ORAL_TABLET | Freq: Three times a day (TID) | ORAL | Status: DC
  Administered 2024-02-15 – 2024-02-16 (×5): 100 mg via ORAL
  Filled 2024-02-15 (×5): qty 1

## 2024-02-15 MED ORDER — LACTATED RINGERS IV SOLN: INTRAVENOUS | Status: AC

## 2024-02-15 MED ORDER — ZOLPIDEM TARTRATE 5 MG PO TABS
5.0000 mg | ORAL_TABLET | Freq: Every evening | ORAL | Status: DC | PRN
Start: 2024-02-15 — End: 2024-02-18

## 2024-02-15 MED ORDER — DIBUCAINE (PERIANAL) 1 % EX OINT: 1.0000 | TOPICAL_OINTMENT | CUTANEOUS | Status: DC | PRN

## 2024-02-15 MED ORDER — HYDRALAZINE HCL 20 MG/ML IJ SOLN: 10.0000 mg | INTRAMUSCULAR | Status: DC | PRN

## 2024-02-15 MED ORDER — ONDANSETRON HCL 4 MG/2ML IJ SOLN: 4.0000 mg | INTRAMUSCULAR | Status: DC | PRN

## 2024-02-15 MED ORDER — MAGNESIUM SULFATE BOLUS VIA INFUSION
4.0000 g | Freq: Once | INTRAVENOUS | Status: AC
  Administered 2024-02-15: 4 g via INTRAVENOUS
  Filled 2024-02-15: qty 1000

## 2024-02-15 MED ORDER — LABETALOL HCL 5 MG/ML IV SOLN
80.0000 mg | INTRAVENOUS | Status: DC | PRN
Start: 2024-02-15 — End: 2024-02-18

## 2024-02-15 MED ORDER — ACETAMINOPHEN 325 MG PO TABS: 650.0000 mg | ORAL_TABLET | ORAL | Status: DC | PRN

## 2024-02-15 MED ORDER — DIPHENHYDRAMINE HCL 25 MG PO CAPS: 25.0000 mg | ORAL_CAPSULE | Freq: Four times a day (QID) | ORAL | Status: DC | PRN

## 2024-02-15 MED ORDER — OXYCODONE HCL 5 MG PO TABS: 10.0000 mg | ORAL_TABLET | ORAL | Status: DC | PRN

## 2024-02-15 NOTE — H&P (Addendum)
 Diamond Smith is a 28 y.o. female presenting for postpartum admission for HELLP syndrome  Patient is a 27 year old gravida 3 para 3 status post induction of labor on 7/22 for gestational hypertension.  She had an uncomplicated labor course and a spontaneous vaginal delivery on 7/23.  Her postpartum course was complicated by sitting hypertension.  She was started on Procardia  30 mg twice daily and labetalol  100 mg 3 times daily.  She was normotensive and stable for discharge on 7/25.The patient had follow-up visits in the office on 7/28 and 7/31.  On 7/28 her Procardia  was increased to 60 mg in the morning 30 at bedtime.  On 7/31 her blood pressures were still slightly elevated and she had started to experience some right upper quadrant pain.  Her nifedipine  was increased to 60 mg twice daily.  Her labetalol  was kept at 100 mg 3 times daily.  A CMP was sent at this visit.  Her LFTs returned earlier today significantly elevated with an AST of 164 and an ALT of 123.  Her glucose was normal, creatinine was normal, hemoglobin normal, platelet count normal at 373.  Given significant elevated liver functions the decision was made to admit for HELLP syndrome and magnesium  sulfate for 24 hours for seizure prophylaxis. OB History     Gravida  3   Para  2   Term  2   Preterm  0   AB  0   Living  2      SAB  0   IAB  0   Ectopic  0   Multiple  0   Live Births  2          Past Medical History:  Diagnosis Date   GERD (gastroesophageal reflux disease)    Pregnancy induced hypertension    No past surgical history on file. Family History: family history includes Hyperlipidemia in her father and mother; Hypertension in her father and mother. Social History:  reports that she has never smoked. She has never used smokeless tobacco. She reports that she does not drink alcohol and does not use drugs.  Review of Systems History as above   Blood pressure 121/78, pulse 90, temperature 97.8 F (36.6  C), temperature source Oral, resp. rate 15, SpO2 98%, currently breastfeeding. Exam Physical Exam  Alert and oriented x 3 swollen appearing Prenatal labs: ABO, Rh: --/--/A POS (07/22 2106) Antibody: NEG (07/22 2106) Rubella: Immune (01/22 0000) RPR: NON REACTIVE (07/22 2106)  HBsAg: Negative (01/22 0000)  HIV: Non-reactive (01/22 0000)  GBS: Positive/-- (07/14 0000)   Results for orders placed or performed during the hospital encounter of 02/15/24 (from the past 24 hours)  Comprehensive metabolic panel     Status: Abnormal   Collection Time: 02/15/24  2:26 PM  Result Value Ref Range   Sodium 138 135 - 145 mmol/L   Potassium 4.3 3.5 - 5.1 mmol/L   Chloride 103 98 - 111 mmol/L   CO2 23 22 - 32 mmol/L   Glucose, Bld 87 70 - 99 mg/dL   BUN 10 6 - 20 mg/dL   Creatinine, Ser 9.40 0.44 - 1.00 mg/dL   Calcium  9.0 8.9 - 10.3 mg/dL   Total Protein 6.9 6.5 - 8.1 g/dL   Albumin 3.5 3.5 - 5.0 g/dL   AST 876 (H) 15 - 41 U/L   ALT 112 (H) 0 - 44 U/L   Alkaline Phosphatase 122 38 - 126 U/L   Total Bilirubin 1.0 0.0 - 1.2 mg/dL  GFR, Estimated >60 >60 mL/min   Anion gap 12 5 - 15  Lactate dehydrogenase     Status: None   Collection Time: 02/15/24  2:26 PM  Result Value Ref Range   LDH 183 98 - 192 U/L  Uric acid     Status: Abnormal   Collection Time: 02/15/24  2:26 PM  Result Value Ref Range   Uric Acid, Serum 8.5 (H) 2.5 - 7.1 mg/dL     Assessment/Plan: 1) admit 2) magnesium  sulfate 4 g load with 2 g every hour for 24 hours for seizure prophylaxis 3) LFTs slightly lower than yesterday however still elevated.  Will recheck tomorrow. 4) continue Procardia  60 mg twice daily and labetalol  100 mg 3 times daily. 5) SCDs for DVT prophylaxis.   Diamond Smith 02/15/2024, 5:31 PM

## 2024-02-16 ENCOUNTER — Encounter (HOSPITAL_COMMUNITY): Payer: Self-pay | Admitting: Obstetrics and Gynecology

## 2024-02-16 LAB — CBC
HCT: 40.6 % (ref 36.0–46.0)
Hemoglobin: 13.5 g/dL (ref 12.0–15.0)
MCH: 29.9 pg (ref 26.0–34.0)
MCHC: 33.3 g/dL (ref 30.0–36.0)
MCV: 89.8 fL (ref 80.0–100.0)
Platelets: 358 K/uL (ref 150–400)
RBC: 4.52 MIL/uL (ref 3.87–5.11)
RDW: 13.3 % (ref 11.5–15.5)
WBC: 6.6 K/uL (ref 4.0–10.5)
nRBC: 0 % (ref 0.0–0.2)

## 2024-02-16 LAB — COMPREHENSIVE METABOLIC PANEL WITH GFR
ALT: 94 U/L — ABNORMAL HIGH (ref 0–44)
AST: 88 U/L — ABNORMAL HIGH (ref 15–41)
Albumin: 3.5 g/dL (ref 3.5–5.0)
Alkaline Phosphatase: 109 U/L (ref 38–126)
Anion gap: 10 (ref 5–15)
BUN: 8 mg/dL (ref 6–20)
CO2: 22 mmol/L (ref 22–32)
Calcium: 6.8 mg/dL — ABNORMAL LOW (ref 8.9–10.3)
Chloride: 103 mmol/L (ref 98–111)
Creatinine, Ser: 0.68 mg/dL (ref 0.44–1.00)
GFR, Estimated: >60 mL/min (ref >60)
Glucose, Bld: 99 mg/dL (ref 70–99)
Potassium: 4 mmol/L (ref 3.5–5.1)
Sodium: 135 mmol/L (ref 135–145)
Total Bilirubin: 0.6 mg/dL (ref 0.0–1.2)
Total Protein: 7 g/dL (ref 6.5–8.1)

## 2024-02-16 MED ORDER — ASPIRIN 81 MG PO CHEW
324.0000 mg | CHEWABLE_TABLET | Freq: Once | ORAL | Status: AC
  Administered 2024-02-16: 324 mg via ORAL
  Filled 2024-02-16: qty 4

## 2024-02-16 MED ORDER — ASPIRIN 81 MG PO CHEW: 81.0000 mg | CHEWABLE_TABLET | Freq: Once | ORAL | Status: DC

## 2024-02-16 NOTE — Plan of Care (Signed)
  Problem: Education: Goal: Knowledge of General Education information will improve Description: Including pain rating scale, medication(s)/side effects and non-pharmacologic comfort measures Outcome: Progressing   Problem: Health Behavior/Discharge Planning: Goal: Ability to manage health-related needs will improve Outcome: Progressing   Problem: Clinical Measurements: Goal: Ability to maintain clinical measurements within normal limits will improve Outcome: Progressing Goal: Will remain free from infection Outcome: Progressing Goal: Diagnostic test results will improve Outcome: Progressing Goal: Respiratory complications will improve Outcome: Progressing Goal: Cardiovascular complication will be avoided Outcome: Progressing   Problem: Activity: Goal: Risk for activity intolerance will decrease Outcome: Progressing   Problem: Nutrition: Goal: Adequate nutrition will be maintained Outcome: Progressing   Problem: Coping: Goal: Level of anxiety will decrease Outcome: Progressing   Problem: Elimination: Goal: Will not experience complications related to bowel motility Outcome: Progressing Goal: Will not experience complications related to urinary retention Outcome: Progressing   Problem: Pain Managment: Goal: General experience of comfort will improve and/or be controlled Outcome: Progressing   Problem: Safety: Goal: Ability to remain free from injury will improve Outcome: Progressing   Problem: Skin Integrity: Goal: Risk for impaired skin integrity will decrease Outcome: Progressing   Problem: Education: Goal: Knowledge of disease or condition will improve Outcome: Progressing Goal: Knowledge of the prescribed therapeutic regimen will improve Outcome: Progressing   Problem: Fluid Volume: Goal: Peripheral tissue perfusion will improve Outcome: Progressing   Problem: Clinical Measurements: Goal: Complications related to disease process, condition or treatment  will be avoided or minimized Outcome: Progressing   Problem: Education: Goal: Knowledge of condition will improve Outcome: Progressing Goal: Individualized Educational Video(s) Outcome: Progressing Goal: Individualized Newborn Educational Video(s) Outcome: Progressing   Problem: Activity: Goal: Will verbalize the importance of balancing activity with adequate rest periods Outcome: Progressing Goal: Ability to tolerate increased activity will improve Outcome: Progressing   Problem: Coping: Goal: Ability to identify and utilize available resources and services will improve Outcome: Progressing   Problem: Life Cycle: Goal: Chance of risk for complications during the postpartum period will decrease Outcome: Progressing   Problem: Role Relationship: Goal: Ability to demonstrate positive interaction with newborn will improve Outcome: Progressing   Problem: Skin Integrity: Goal: Demonstration of wound healing without infection will improve Outcome: Progressing

## 2024-02-16 NOTE — Progress Notes (Signed)
 Patient ID: Diamond Smith, female   DOB: 11-08-1995, 28 y.o.   MRN: [id number] Pt reports pain under ribs across upper abdomen. She denies HA, SOB or CP. Pain well controlled with tylenol . Tolerating magnesium  sulfate well  Blood pressure 124/78, pulse 79, temperature 97.8 F (36.6 C), temperature source Oral, resp. rate 18, SpO2 96%, currently breastfeeding.  GEN - NAD ABD - soft, ND EXT - scds in place   6.6>13.5<358  A/P: 28yo G3 now P3003 female on HD#2, PPD#10 following svd with subsequent PP preE based on elevated BP and liver enzymes  - Continue on procardia  60mg  bid and labetalol  100mg  tid  - Pt currently on magnesium  sulfate - due to stop in an hour. Tolerating well. Will monitor BP once completed      - If BP stays normal, likely discharge home tomorrow

## 2024-02-17 MED ORDER — LABETALOL HCL 200 MG PO TABS: 200.0000 mg | ORAL_TABLET | Freq: Three times a day (TID) | ORAL | Status: DC

## 2024-02-17 MED ORDER — LABETALOL HCL 100 MG PO TABS
100.0000 mg | ORAL_TABLET | Freq: Three times a day (TID) | ORAL | Status: DC
  Administered 2024-02-17 – 2024-02-18 (×2): 100 mg via ORAL
  Filled 2024-02-17 (×2): qty 1

## 2024-02-17 MED ORDER — CALCIUM GLUCONATE-NACL 1-0.675 GM/50ML-% IV SOLN
1.0000 g | Freq: Once | INTRAVENOUS | Status: AC
  Administered 2024-02-17: 1000 mg via INTRAVENOUS
  Filled 2024-02-17: qty 50

## 2024-02-17 MED ORDER — LABETALOL HCL 100 MG PO TABS
100.0000 mg | ORAL_TABLET | Freq: Once | ORAL | Status: AC
  Administered 2024-02-17: 100 mg via ORAL
  Filled 2024-02-17: qty 1

## 2024-02-17 MED ORDER — LABETALOL HCL 100 MG PO TABS
100.0000 mg | ORAL_TABLET | Freq: Three times a day (TID) | ORAL | Status: DC
  Administered 2024-02-17 (×2): 100 mg via ORAL
  Filled 2024-02-17 (×2): qty 1

## 2024-02-17 MED ORDER — SODIUM CHLORIDE 0.9 % IV SOLN: 1.0000 g | Freq: Once | INTRAVENOUS | Status: DC

## 2024-02-17 MED ORDER — LABETALOL HCL 100 MG PO TABS: 100.0000 mg | ORAL_TABLET | Freq: Three times a day (TID) | ORAL | 0 refills | Status: DC

## 2024-02-17 NOTE — Progress Notes (Addendum)
 Patient ID: Diamond Smith, female   DOB: [birth year], 28 y.o.   MRN: [id number] Late entry  Pt BP crept steadility over course of day reaching near severe range with procardia  held. An extra dose of labetalol  100mg  did not improve BP significantly hence procardia  was restarted ( 60mg )  BP responded well  Pt advised to inform staff if sx she experiences last night return - warm sensation in arm and dizziness. Sx of HA or visual changes also to be reported.   Will monitor BP to see if pt does well with just 60xl procardia  daily vs bid as before.  Will continue on labetalol  100mg  po tid.  Will discharge plan based on stable BP on meds; possible in am

## 2024-02-17 NOTE — Progress Notes (Signed)
 Patient ID: Diamond Smith, female   DOB: 02-29-1996, 28 y.o.   MRN: [id number] Pt seen this am. Ambulated to bathroom and voided and had BM with no bouts of lightheadedness or dizziness. Reports feels well. She specifically denies any further arm tingling or HA  VS: 118-122/78-83, HR 79 ABD - soft, ND EXT - no homans   Labs: AST decreased from 123 to 88 last night           ALT decreased from 112 to 94 last night   Ca low 6.8  A/P: 28yo G3 now P3003 female on HD#3, PPD#11 following svd  PP preE based on elevated BP and liver enzymes ; s/p 24hrs of MgSO4 - Procardia  held last night and BP stayed well controlled on only labetalol  100mg  with resolution of adverse sx per pt. Continue on  labetalol  100mg  tid today ( hold procardia  again) and adjust labetalol  per BP control  2. Hypocalcemia - replace IV x 1 today then continue orally. Suspect improvement with holding procardia . Follow per sx.   3. Postpartum care: routine. Possible discharge to home later today with close outpt follow up

## 2024-02-17 NOTE — Discharge Instructions (Signed)
 Call office with any concerns [phone]

## 2024-02-17 NOTE — Progress Notes (Signed)
 Patient ID: Diamond Smith, female   DOB: 10-25-1995, 28 y.o.   MRN: [id number] I was called last night by nurse and informed pt complained of a warm sensation down her arm.  Orthostatic BP done and standing BP noted to be low and tachycardia also noted.  On repeat, BP improved but tachycardia still present No neurological concerns elicited. Great equal arm and grip strength noted.   Given pt pm meds were due ( procardia  and labetalol ) I advised nurse to hold procardia  and only give labetalol  for continued BP control and to improve tachycardia  BP checked an hour later was normal and HR improved as well. Pt also reported resolution of warm sensation   Pt continued to have normal BP and HR overnight and into this am  I recommend continue to hold procardia  and only administer labetalol  200mg  po tid   Likely discharge to home later today with continued stability

## 2024-02-17 NOTE — Plan of Care (Signed)
  Problem: Education: Goal: Knowledge of General Education information will improve Description: Including pain rating scale, medication(s)/side effects and non-pharmacologic comfort measures Outcome: Progressing   Problem: Health Behavior/Discharge Planning: Goal: Ability to manage health-related needs will improve Outcome: Progressing   Problem: Clinical Measurements: Goal: Ability to maintain clinical measurements within normal limits will improve Outcome: Progressing Goal: Will remain free from infection Outcome: Progressing Goal: Diagnostic test results will improve Outcome: Progressing Goal: Respiratory complications will improve Outcome: Progressing Goal: Cardiovascular complication will be avoided Outcome: Progressing   Problem: Activity: Goal: Risk for activity intolerance will decrease Outcome: Progressing   Problem: Nutrition: Goal: Adequate nutrition will be maintained Outcome: Progressing   Problem: Coping: Goal: Level of anxiety will decrease Outcome: Progressing   Problem: Elimination: Goal: Will not experience complications related to bowel motility Outcome: Progressing Goal: Will not experience complications related to urinary retention Outcome: Progressing   Problem: Pain Managment: Goal: General experience of comfort will improve and/or be controlled Outcome: Progressing   Problem: Safety: Goal: Ability to remain free from injury will improve Outcome: Progressing   Problem: Skin Integrity: Goal: Risk for impaired skin integrity will decrease Outcome: Progressing   Problem: Education: Goal: Knowledge of disease or condition will improve Outcome: Progressing Goal: Knowledge of the prescribed therapeutic regimen will improve Outcome: Progressing   Problem: Fluid Volume: Goal: Peripheral tissue perfusion will improve Outcome: Progressing   Problem: Clinical Measurements: Goal: Complications related to disease process, condition or treatment  will be avoided or minimized Outcome: Progressing   Problem: Education: Goal: Knowledge of condition will improve Outcome: Progressing Goal: Individualized Educational Video(s) Outcome: Progressing Goal: Individualized Newborn Educational Video(s) Outcome: Progressing   Problem: Activity: Goal: Will verbalize the importance of balancing activity with adequate rest periods Outcome: Progressing Goal: Ability to tolerate increased activity will improve Outcome: Progressing   Problem: Coping: Goal: Ability to identify and utilize available resources and services will improve Outcome: Progressing   Problem: Life Cycle: Goal: Chance of risk for complications during the postpartum period will decrease Outcome: Progressing   Problem: Role Relationship: Goal: Ability to demonstrate positive interaction with newborn will improve Outcome: Progressing   Problem: Skin Integrity: Goal: Demonstration of wound healing without infection will improve Outcome: Progressing

## 2024-02-18 DIAGNOSIS — O1425 HELLP syndrome, complicating the puerperium: Secondary | ICD-10-CM | POA: Diagnosis not present

## 2024-02-18 LAB — COMPREHENSIVE METABOLIC PANEL WITH GFR
ALT: 70 U/L — ABNORMAL HIGH (ref 0–44)
AST: 56 U/L — ABNORMAL HIGH (ref 15–41)
Albumin: 3.6 g/dL (ref 3.5–5.0)
Alkaline Phosphatase: 112 U/L (ref 38–126)
Anion gap: 13 (ref 5–15)
BUN: 10 mg/dL (ref 6–20)
CO2: 21 mmol/L — ABNORMAL LOW (ref 22–32)
Calcium: 10 mg/dL (ref 8.9–10.3)
Chloride: 106 mmol/L (ref 98–111)
Creatinine, Ser: 0.68 mg/dL (ref 0.44–1.00)
GFR, Estimated: >60 mL/min (ref >60)
Glucose, Bld: 124 mg/dL — ABNORMAL HIGH (ref 70–99)
Potassium: 4 mmol/L (ref 3.5–5.1)
Sodium: 140 mmol/L (ref 135–145)
Total Bilirubin: 1.1 mg/dL (ref 0.0–1.2)
Total Protein: 7.2 g/dL (ref 6.5–8.1)

## 2024-02-18 MED ORDER — LORATADINE 10 MG PO TABS
10.0000 mg | ORAL_TABLET | Freq: Every day | ORAL | Status: DC
  Administered 2024-02-18: 10 mg via ORAL
  Filled 2024-02-18: qty 1

## 2024-02-18 MED ORDER — NIFEDIPINE ER 60 MG PO TB24: 60.0000 mg | ORAL_TABLET | Freq: Every day | ORAL | Status: DC

## 2024-02-18 MED ORDER — NIFEDIPINE ER OSMOTIC RELEASE 60 MG PO TB24
60.0000 mg | ORAL_TABLET | Freq: Every day | ORAL | Status: DC
  Administered 2024-02-18: 60 mg via ORAL
  Filled 2024-02-18: qty 1

## 2024-02-18 NOTE — Discharge Summary (Signed)
 Postpartum Discharge Summary  Date of Service updated 8/1-02/18/2024     Patient Name: Diamond Smith DOB: 1996-05-10 MRN: [id number]  Date of admission: 02/15/2024 Delivery date:02/06/2024 Delivering provider: GRETTA GUMS Date of discharge: 02/18/2024  Admitting diagnosis: post-partum pre-eclampsia Intrauterine pregnancy: none      Discharge diagnosis:  same                                               Hospital course: Diamond Smith is a 28 year old G3P3003 admitted on PPD#9 s/p SVD for postpartum pre-eclampsia. She had been see in clinic for blood pressure check and medication titration and was found to have RUQ soreness and elevated liver enzymes. She was admitted for management and given 24 hours of IV magnesium . Her LFTs improved as follows:  -AST decreased from 123 on 8/1 to 56 on 8/4 -ALT decreased from 112 on 8/1 to 70 on 8/4. Blood pressure medications were titrated to final dosing of nifedipine  60mg  qAM and labetalol  100mg  TID. During her hospitalization, she was found to have hypocalcemia in the setting of magnesium  infusion. She received IV and oral replacement and her discharge calcium  was within normal range.  Physical exam  Vitals:   02/18/24 0508 02/18/24 0700 02/18/24 0818 02/18/24 1245  BP: (!) 141/84  (!) 143/90 (!) 133/91  Pulse: 63 65 63 87  Resp:   19 18  Temp:   97.8 F (36.6 C) 98.1 F (36.7 C)  TempSrc:   Oral Oral  SpO2:   100% 100%   Physical Exam:  General: no acute distress Pulm: normal work of breathing on room air Card: well perfused MSK: normal ROM, no e/o DVT Neuro: no focal deficits, oriented x3 Psych: normal mood, normal thought  Labs: Lab Results  Component Value Date   WBC 6.6 02/16/2024   HGB 13.5 02/16/2024   HCT 40.6 02/16/2024   MCV 89.8 02/16/2024   PLT 358 02/16/2024      Latest Ref Rng & Units 02/18/2024   10:14 AM  CMP  Glucose 70 - 99 mg/dL 875   BUN 6 - 20 mg/dL 10   Creatinine 9.55 - 1.00 mg/dL 9.31   Sodium 864 - 854  mmol/L 140   Potassium 3.5 - 5.1 mmol/L 4.0   Chloride 98 - 111 mmol/L 106   CO2 22 - 32 mmol/L 21   Calcium  8.9 - 10.3 mg/dL 89.9   Total Protein 6.5 - 8.1 g/dL 7.2   Total Bilirubin 0.0 - 1.2 mg/dL 1.1   Alkaline Phos 38 - 126 U/L 112   AST 15 - 41 U/L 56   ALT 0 - 44 U/L 70    Edinburgh Score:    02/07/2024    3:40 PM  Edinburgh Postnatal Depression Scale Screening Tool  I have been able to laugh and see the funny side of things. 0  I have looked forward with enjoyment to things. 0  I have blamed myself unnecessarily when things went wrong. 0  I have been anxious or worried for no good reason. 0  I have felt scared or panicky for no good reason. 0  Things have been getting on top of me. 0  I have been so unhappy that I have had difficulty sleeping. 0  I have felt sad or miserable. 0  I have been so unhappy that  I have been crying. 0  The thought of harming myself has occurred to me. 0  Edinburgh Postnatal Depression Scale Total 0      After visit meds:  Allergies as of 02/18/2024       Reactions   Covid-19 (mrna) Vaccine Hives        Medication List     TAKE these medications    acetaminophen  325 MG tablet Commonly known as: Tylenol  Take 2 tablets (650 mg total) by mouth every 6 (six) hours as needed (for pain scale < 4).   cetirizine  10 MG tablet Commonly known as: ZYRTEC  Take 1 tablet (10 mg total) by mouth daily.   ibuprofen  200 MG tablet Commonly known as: ADVIL  Take 3 tablets (600 mg total) by mouth every 6 (six) hours as needed.   labetalol  100 MG tablet Commonly known as: NORMODYNE  Take 1 tablet (100 mg total) by mouth every 8 (eight) hours.   montelukast 10 MG tablet Commonly known as: SINGULAIR 1 tablet Orally Once a day for 90 days   Nasacort Allergy 24HR 55 MCG/ACT Aero nasal inhaler Generic drug: triamcinolone Place 2 sprays into the nose daily.   NIFEdipine  60 MG 24 hr tablet Commonly known as: ADALAT  CC Take 1 tablet (60 mg total)  by mouth daily with breakfast. Start taking on: February 19, 2024 What changed:  medication strength how much to take when to take this         Discharge home in stable condition Discharge instruction: per After Visit Summary and Postpartum booklet. Activity: Advance as tolerated. Pelvic rest for 6 weeks.  Diet: routine diet Anticipated Birth Control: Unsure Postpartum Appointment:2-3 days Additional Postpartum F/U: BP check 2-3 days  Follow up Visit:  Follow-up Information     Ob/Gyn, Landy Stains. Go in 2 day(s).   Why: for BP check Contact information: [address] [place] [address] [phone]                     02/18/2024 Rubie DELENA Husky, MD

## 2024-02-18 NOTE — Plan of Care (Signed)
 Completed at time of discharge.  Problem: Education: Goal: Knowledge of General Education information will improve Description: Including pain rating scale, medication(s)/side effects and non-pharmacologic comfort measures Outcome: Adequate for Discharge   Problem: Health Behavior/Discharge Planning: Goal: Ability to manage health-related needs will improve Outcome: Adequate for Discharge   Problem: Clinical Measurements: Goal: Ability to maintain clinical measurements within normal limits will improve Outcome: Adequate for Discharge Goal: Will remain free from infection Outcome: Adequate for Discharge Goal: Diagnostic test results will improve Outcome: Adequate for Discharge Goal: Respiratory complications will improve Outcome: Adequate for Discharge Goal: Cardiovascular complication will be avoided Outcome: Adequate for Discharge   Problem: Activity: Goal: Risk for activity intolerance will decrease Outcome: Adequate for Discharge   Problem: Nutrition: Goal: Adequate nutrition will be maintained Outcome: Adequate for Discharge   Problem: Coping: Goal: Level of anxiety will decrease Outcome: Adequate for Discharge   Problem: Elimination: Goal: Will not experience complications related to bowel motility Outcome: Adequate for Discharge Goal: Will not experience complications related to urinary retention Outcome: Adequate for Discharge   Problem: Pain Managment: Goal: General experience of comfort will improve and/or be controlled Outcome: Adequate for Discharge   Problem: Safety: Goal: Ability to remain free from injury will improve Outcome: Adequate for Discharge   Problem: Skin Integrity: Goal: Risk for impaired skin integrity will decrease Outcome: Adequate for Discharge   Problem: Education: Goal: Knowledge of disease or condition will improve Outcome: Adequate for Discharge Goal: Knowledge of the prescribed therapeutic regimen will improve Outcome: Adequate  for Discharge   Problem: Fluid Volume: Goal: Peripheral tissue perfusion will improve Outcome: Adequate for Discharge   Problem: Clinical Measurements: Goal: Complications related to disease process, condition or treatment will be avoided or minimized Outcome: Adequate for Discharge   Problem: Education: Goal: Knowledge of condition will improve Outcome: Adequate for Discharge Goal: Individualized Educational Video(s) Outcome: Adequate for Discharge Goal: Individualized Newborn Educational Video(s) Outcome: Adequate for Discharge   Problem: Activity: Goal: Will verbalize the importance of balancing activity with adequate rest periods Outcome: Adequate for Discharge Goal: Ability to tolerate increased activity will improve Outcome: Adequate for Discharge   Problem: Coping: Goal: Ability to identify and utilize available resources and services will improve Outcome: Adequate for Discharge   Problem: Life Cycle: Goal: Chance of risk for complications during the postpartum period will decrease Outcome: Adequate for Discharge   Problem: Role Relationship: Goal: Ability to demonstrate positive interaction with newborn will improve Outcome: Adequate for Discharge   Problem: Skin Integrity: Goal: Demonstration of wound healing without infection will improve Outcome: Adequate for Discharge

## 2024-02-18 NOTE — Progress Notes (Signed)
   02/18/24 1412  Departure Condition  Departure Condition Good  Mobility at [facility]  Patient/Caregiver Teaching Teach Back Method Used;Discharge instructions reviewed;Prescriptions reviewed;Follow-up care reviewed;Patient/caregiver verbalized understanding;Medications discussed;Pain management discussed;Admission discussed  Departure Mode With significant other  Was procedural sedation performed on this patient during this visit? No   Patient alert and oriented x4, VS and pain stable.

## 2024-02-18 NOTE — Progress Notes (Signed)
 Patient ID: Diamond Smith, female   DOB: 18-Apr-1996, 28 y.o.   MRN: [id number] [name] is feeling well this morning. Her only complaint is some new onset itching in her palms and soles this morning. She denies headache, vision changes, chest pain, shortness of breath, and right upper quadrant pain.      02/18/2024    8:18 AM 02/18/2024    7:00 AM 02/18/2024    5:08 AM  Vitals with BMI  Systolic 143  141  Diastolic 90  84  Pulse 63 65 63      ABD - soft, ND EXT - no homans Skin - 1cm raised red lesion with appearance of urticaria noted on neck. Right toe with signs of scratching. Skin otherwise normal appearing.   Labs: AST decreased from 123 on 8/1 to 88 8/2 PM           ALT decreased from 112 on 8/1 to 94 8/2 PM  Ca 6.8-->pending  A/P: 28yo G3 now P3003 female on HD#4, PPD#12 following svd  PP preE based on elevated BP and liver enzymes ; s/p 24hrs of MgSO4 - Procardia  held based on lower Bps, then restarted last night for elevated Bps, currently . Labetalol  100mg  TID continued today. Bps this AM 140s/90-90.  -Repeat CMP pending.  2. Hypocalcemia - s/p IV replacement x 1 yesterday, continuing orally. Repeat CMP pending.  3. Postpartum care: routine.  4. Pruritus: Patient reports this is common for her, offered Benadryl , requests Zyrtec , which is not on formulary; Claritin  ordered.   Dispo: Likely PM discharge today pending above with clinic follow-up in two days.

## 2024-02-20 DIAGNOSIS — O1495 Unspecified pre-eclampsia, complicating the puerperium: Secondary | ICD-10-CM | POA: Diagnosis not present

## 2024-02-20 DIAGNOSIS — O1490 Unspecified pre-eclampsia, unspecified trimester: Secondary | ICD-10-CM | POA: Diagnosis not present

## 2024-02-25 DIAGNOSIS — Z8759 Personal history of other complications of pregnancy, childbirth and the puerperium: Secondary | ICD-10-CM | POA: Diagnosis not present

## 2024-02-25 DIAGNOSIS — O1495 Unspecified pre-eclampsia, complicating the puerperium: Secondary | ICD-10-CM | POA: Diagnosis not present

## 2024-02-26 ENCOUNTER — Telehealth (HOSPITAL_COMMUNITY): Payer: Self-pay | Admitting: *Deleted

## 2024-02-26 NOTE — Telephone Encounter (Signed)
 02/26/2024  Name: Diamond Smith MRN: [id number] DOB: September 06, 1995  Reason for Call:  Transition of Care Hospital Discharge Call  Contact Status: Patient Contact Status: Complete  Language assistant needed: Interpreter Mode: Interpreter Not Needed        Follow-Up Questions: Do You Have Any Concerns About Your Health As You Heal From Delivery?: Yes What Concerns Do You Have About Your Health?: She is seeing OB weekly for careful BP and lab monitoring.  Saw OB yesterday and OB said that things were moving in the right direction.  She verbalized reasons to seek immediate care regarding pre-eclampsia (persistent HA, visual changes, RUQ pain). Do You Have Any Concerns About Your Infants Health?: Yes What Concerns Do You Have About Your Baby?: Thinks baby may have nasal congestion. Has not seen any mucous from nose. No fevers. Eating very well. Breathing normally.  Advised that some congestion can be normal and to call pediatrician if she begins to see any of the above symptoms.  Edinburgh Postnatal Depression Scale:  In the Past 7 Days:    PHQ2-9 Depression Scale:     Discharge Follow-up: Edinburgh score requires follow up?:  (declines screening, says she is doing well emotionally) Patient was advised of the following resources:: Breastfeeding Support Group, Support Group (declines postpartum group information via email)  Post-discharge interventions: Reviewed Newborn Safe Sleep Practices  Mliss Sieve, RN 02/26/2024 10:32

## 2024-03-03 ENCOUNTER — Inpatient Hospital Stay (HOSPITAL_COMMUNITY)

## 2024-03-06 DIAGNOSIS — Z1331 Encounter for screening for depression: Secondary | ICD-10-CM | POA: Diagnosis not present

## 2024-03-06 DIAGNOSIS — R748 Abnormal levels of other serum enzymes: Secondary | ICD-10-CM | POA: Diagnosis not present

## 2024-04-08 ENCOUNTER — Ambulatory Visit: Admitting: Family Medicine

## 2024-04-08 ENCOUNTER — Ambulatory Visit: Payer: Self-pay | Admitting: Family Medicine

## 2024-04-08 ENCOUNTER — Encounter: Payer: Self-pay | Admitting: Family Medicine

## 2024-04-08 VITALS — BP 124/76 | HR 98 | Temp 97.8°F | Ht 61.0 in | Wt 174.8 lb

## 2024-04-08 DIAGNOSIS — L700 Acne vulgaris: Secondary | ICD-10-CM | POA: Insufficient documentation

## 2024-04-08 DIAGNOSIS — L309 Dermatitis, unspecified: Secondary | ICD-10-CM | POA: Insufficient documentation

## 2024-04-08 DIAGNOSIS — Z8759 Personal history of other complications of pregnancy, childbirth and the puerperium: Secondary | ICD-10-CM | POA: Diagnosis not present

## 2024-04-08 DIAGNOSIS — Z23 Encounter for immunization: Secondary | ICD-10-CM

## 2024-04-08 LAB — COMPREHENSIVE METABOLIC PANEL WITH GFR
ALT: 40 U/L — ABNORMAL HIGH (ref 0–35)
AST: 42 U/L — ABNORMAL HIGH (ref 0–37)
Albumin: 4.8 g/dL (ref 3.5–5.2)
Alkaline Phosphatase: 68 U/L (ref 39–117)
BUN: 11 mg/dL (ref 6–23)
CO2: 21 meq/L (ref 19–32)
Calcium: 9.6 mg/dL (ref 8.4–10.5)
Chloride: 104 meq/L (ref 96–112)
Creatinine, Ser: 0.64 mg/dL (ref 0.40–1.20)
GFR: 120.27 mL/min
Glucose, Bld: 87 mg/dL (ref 70–99)
Potassium: 4 meq/L (ref 3.5–5.1)
Sodium: 138 meq/L (ref 135–145)
Total Bilirubin: 0.9 mg/dL (ref 0.2–1.2)
Total Protein: 7.4 g/dL (ref 6.0–8.3)

## 2024-04-08 LAB — CBC
HCT: 43.5 % (ref 36.0–46.0)
Hemoglobin: 14.3 g/dL (ref 12.0–15.0)
MCHC: 33 g/dL (ref 30.0–36.0)
MCV: 87.8 fl (ref 78.0–100.0)
Platelets: 324 K/uL (ref 150.0–400.0)
RBC: 4.96 Mil/uL (ref 3.87–5.11)
RDW: 13.2 % (ref 11.5–15.5)
WBC: 6.3 K/uL (ref 4.0–10.5)

## 2024-04-08 NOTE — Assessment & Plan Note (Addendum)
 Discussed management of gestational hypertension in the postpartum period. I recommend we repeat her CBC and LFTs to make sure her HELLP syndrome has resolved. I will have her stop her nifedipine  and see how her BP does. She should continue labetalol  100 mg bid. She will send me her BP log in 1 week.

## 2024-04-08 NOTE — Progress Notes (Signed)
 [facility]ARE [facility] [address] [place] [address] Dept: [phone] Dept Fax: [phone]  Office Visit  Subjective:    Patient ID: Diamond Smith, female    DOB: 08/13/1995, 28 y.o..   MRN: [id number]  Chief Complaint  Patient presents with   Hypertension    F/u to discuss having elevated BP while pregnant.  Flu shot today.    History of Present Illness:  Patient is in today for management of her gestational hypertension. Ms. Chirico delivered her son on 02/06/2024. She had developed gestational hypertension. Subsequently, she developed a HELLP syndrome. She was admitted for monitoring and to initiate antihypertensives. She is currently managed on labetalol  100 mg bid and nifedipine  30 mg bid. She started back to exercising with her trainer yesterday. She feels a little swollen in her hands and feet at times. She denies any headache.  Past Medical History: Patient Active Problem List   Diagnosis Date Noted   Acne vulgaris 04/08/2024   Eczema 04/08/2024   HELLP syndrome 02/15/2024   Gestational hypertension w/o significant proteinuria in 3rd trimester 02/05/2024   History of gestational hypertension 09/07/2022   Vitamin D deficiency 09/07/2022   Borderline hyperlipidemia 09/07/2022   Overweight (BMI 25.0-29.9) 10/14/2021   Idiopathic urticaria 03/28/2021   Allergic rhinitis 03/28/2021   Low back pain 09/12/2018   Low grade squamous intraepith lesion on cytologic smear cervix (lgsil) 06/21/2018   Gastroesophageal reflux disease without esophagitis 09/14/2017   History reviewed. No pertinent surgical history. Family History  Problem Relation Age of Onset   Hyperlipidemia Mother    Hypertension Mother    Hyperlipidemia Father    Hypertension Father    Outpatient Medications Prior to Visit  Medication Sig Dispense Refill   acetaminophen  (TYLENOL ) 325 MG tablet Take 2 tablets (650 mg total) by mouth every 6 (six) hours as  needed (for pain scale < 4).     cetirizine  (ZYRTEC ) 10 MG tablet Take 1 tablet (10 mg total) by mouth daily. 30 tablet 11   ibuprofen  (ADVIL ) 200 MG tablet Take 3 tablets (600 mg total) by mouth every 6 (six) hours as needed.     labetalol  (NORMODYNE ) 100 MG tablet Take 1 tablet (100 mg total) by mouth every 8 (eight) hours. (Patient taking differently: Take 100 mg by mouth 2 (two) times daily.) 90 tablet 0   levocetirizine (XYZAL) 5 MG tablet Take 5 mg by mouth daily.     NIFEdipine  (ADALAT  CC) 60 MG 24 hr tablet Take 1 tablet (60 mg total) by mouth daily with breakfast. (Patient taking differently: Take 30 mg by mouth in the morning and at bedtime.)     montelukast (SINGULAIR) 10 MG tablet 1 tablet Orally Once a day for 90 days     NASACORT ALLERGY 24HR 55 MCG/ACT AERO nasal inhaler Place 2 sprays into the nose daily.     No facility-administered medications prior to visit.   Allergies  Allergen Reactions   Covid-19 (Mrna) Vaccine Hives     Objective:   Today's Vitals   04/08/24 1100  BP: 124/76  Pulse: 98  Temp: 97.8 F (36.6 C)  TempSrc: Temporal  SpO2: 98%  Weight: 174 lb 12.8 oz (79.3 kg)  Height: 5' 1 (1.549 m)   Body mass index is 33.03 kg/m.   General: Well developed, well nourished. No acute distress. Extremities: No edema. Psych: Alert and oriented. Normal mood and affect.  Health Maintenance Due  Topic Date Due   Hepatitis B Vaccines  19-59 Average Risk (1 of 3 - 19+ 3-dose series) Never done   HPV VACCINES (1 - 3-dose SCDM series) Never done   Influenza Vaccine  02/15/2024     Assessment & Plan:   Problem List Items Addressed This Visit       Other   History of gestational hypertension   Discussed management of gestational hypertension in the postpartum period. I recommend we repeat her CBC and LFTs to make sure her HELLP syndrome has resolved. I will have her stop her nifedipine  and see how her BP does. She should continue labetalol  100 mg bid. She will  send me her BP log in 1 week.      Relevant Orders   CBC   Comprehensive metabolic panel with GFR   Other Visit Diagnoses       Need for immunization against influenza    -  Primary   Relevant Orders   Flu vaccine trivalent PF, 6mos and older(Flulaval,Afluria,Fluarix,Fluzone) (Completed)       Return in about 2 months (around 06/08/2024) for Reassessment.   Garnette CHRISTELLA Simpler, MD

## 2024-04-08 NOTE — Patient Instructions (Signed)
 Monitor BP at home. This should be under 130/80.

## 2024-04-21 ENCOUNTER — Encounter: Payer: Self-pay | Admitting: Family Medicine

## 2024-04-22 ENCOUNTER — Telehealth: Payer: Self-pay

## 2024-04-22 NOTE — Telephone Encounter (Signed)
 Patient/patient representative is calling to schedule an appointment. Refer to attachments for appointment information. Patient is calling to schedule an appointment with Dr.Rudd for next week to do an abdominal exam and to repeat blood tests; however, his next opening is not until 05/07/2024. Patient would like to know if it's okay to wait until then or if Dr.Rudd wanted to see her next week. Patient has been scheduled and added to the wait list.   Dr. Thedora is it okay for her to see you then?

## 2024-04-23 ENCOUNTER — Ambulatory Visit: Admitting: Family Medicine

## 2024-04-23 NOTE — Telephone Encounter (Signed)
 Contacted patient and she stated her pain has decreased so she is okay with keeping her appt for the 22nd. NFN

## 2024-05-07 ENCOUNTER — Ambulatory Visit: Payer: Self-pay | Admitting: Family Medicine

## 2024-05-07 ENCOUNTER — Ambulatory Visit: Admitting: Family Medicine

## 2024-05-07 ENCOUNTER — Encounter: Payer: Self-pay | Admitting: Family Medicine

## 2024-05-07 VITALS — BP 120/74 | HR 92 | Temp 97.1°F | Ht 61.0 in | Wt 175.0 lb

## 2024-05-07 DIAGNOSIS — F411 Generalized anxiety disorder: Secondary | ICD-10-CM | POA: Diagnosis not present

## 2024-05-07 DIAGNOSIS — R1011 Right upper quadrant pain: Secondary | ICD-10-CM | POA: Insufficient documentation

## 2024-05-07 DIAGNOSIS — F410 Panic disorder [episodic paroxysmal anxiety] without agoraphobia: Secondary | ICD-10-CM | POA: Insufficient documentation

## 2024-05-07 DIAGNOSIS — Z8759 Personal history of other complications of pregnancy, childbirth and the puerperium: Secondary | ICD-10-CM

## 2024-05-07 LAB — CBC WITH DIFFERENTIAL/PLATELET
Basophils Absolute: 0 K/uL (ref 0.0–0.1)
Basophils Relative: 0.3 % (ref 0.0–3.0)
Eosinophils Absolute: 0.1 K/uL (ref 0.0–0.7)
Eosinophils Relative: 1.2 % (ref 0.0–5.0)
HCT: 44.1 % (ref 36.0–46.0)
Hemoglobin: 14.6 g/dL (ref 12.0–15.0)
Lymphocytes Relative: 32.7 % (ref 12.0–46.0)
Lymphs Abs: 2.1 K/uL (ref 0.7–4.0)
MCHC: 33.1 g/dL (ref 30.0–36.0)
MCV: 87.6 fl (ref 78.0–100.0)
Monocytes Absolute: 0.4 K/uL (ref 0.1–1.0)
Monocytes Relative: 6.7 % (ref 3.0–12.0)
Neutro Abs: 3.8 K/uL (ref 1.4–7.7)
Neutrophils Relative %: 59.1 % (ref 43.0–77.0)
Platelets: 326 K/uL (ref 150.0–400.0)
RBC: 5.03 Mil/uL (ref 3.87–5.11)
RDW: 12.8 % (ref 11.5–15.5)
WBC: 6.4 K/uL (ref 4.0–10.5)

## 2024-05-07 LAB — COMPREHENSIVE METABOLIC PANEL WITH GFR
ALT: 42 U/L — ABNORMAL HIGH (ref 0–35)
AST: 30 U/L (ref 0–37)
Albumin: 5 g/dL (ref 3.5–5.2)
Alkaline Phosphatase: 77 U/L (ref 39–117)
BUN: 11 mg/dL (ref 6–23)
CO2: 27 meq/L (ref 19–32)
Calcium: 9.6 mg/dL (ref 8.4–10.5)
Chloride: 99 meq/L (ref 96–112)
Creatinine, Ser: 0.77 mg/dL (ref 0.40–1.20)
GFR: 104.93 mL/min (ref >60.00)
Glucose, Bld: 95 mg/dL (ref 70–99)
Potassium: 4.3 meq/L (ref 3.5–5.1)
Sodium: 136 meq/L (ref 135–145)
Total Bilirubin: 1.3 mg/dL — ABNORMAL HIGH (ref 0.2–1.2)
Total Protein: 7.7 g/dL (ref 6.0–8.3)

## 2024-05-07 LAB — LIPASE: Lipase: 54 U/L (ref 11.0–59.0)

## 2024-05-07 NOTE — Assessment & Plan Note (Signed)
 Diamond Smith mentions episodes of panic. We discussed use of deep breathing exercises. She is engaged with counseling.

## 2024-05-07 NOTE — Progress Notes (Signed)
 [facility] [address] [place] [address] Dept: [phone] Dept Fax: [phone]  Chronic Care Office Visit  Subjective:    Patient ID: Diamond Smith, female    DOB: 1996-01-22, 28 y.o..   MRN: [id number]  Chief Complaint  Patient presents with   Abdominal Pain    C/o having some upper RT abdominal pain off/on.  No OTC meds taken.    History of Present Illness:  Patient is in today for reassessment of chronic medical issues.  Ms. Oviedo delivered her son on 02/06/2024. She had developed gestational hypertension. Subsequently, she developed a HELLP syndrome. She was admitted for monitoring and to initiate antihypertensives. She is currently managed on labetalol  100 mg bid and nifedipine  30 mg bid. At her last visit on 9/23, I had recommended she stop her nifedipine . However, she reached out to me on 10/6 noting her home blood pressures were averaging 124/92 and she was having RUQ abdominal pain. She notes today that she has continued ot see high pressures at home and is still having episodes of this pain off and on.  Past Medical History: Patient Active Problem List   Diagnosis Date Noted   Generalized anxiety disorder with panic attacks 05/07/2024   Acne vulgaris 04/08/2024   Eczema 04/08/2024   HELLP syndrome 02/15/2024   Gestational hypertension w/o significant proteinuria in 3rd trimester 02/05/2024   History of gestational hypertension 09/07/2022   Vitamin D deficiency 09/07/2022   Borderline hyperlipidemia 09/07/2022   Overweight (BMI 25.0-29.9) 10/14/2021   Idiopathic urticaria 03/28/2021   Allergic rhinitis 03/28/2021   Low back pain 09/12/2018   Low grade squamous intraepith lesion on cytologic smear cervix (lgsil) 06/21/2018   Gastroesophageal reflux disease without esophagitis 09/14/2017   History reviewed. No pertinent surgical history. Family History  Problem Relation Age of Onset   Hyperlipidemia Mother     Hypertension Mother    Hyperlipidemia Father    Hypertension Father    Outpatient Medications Prior to Visit  Medication Sig Dispense Refill   acetaminophen  (TYLENOL ) 325 MG tablet Take 2 tablets (650 mg total) by mouth every 6 (six) hours as needed (for pain scale < 4).     cetirizine  (ZYRTEC ) 10 MG tablet Take 1 tablet (10 mg total) by mouth daily. 30 tablet 11   ibuprofen  (ADVIL ) 200 MG tablet Take 3 tablets (600 mg total) by mouth every 6 (six) hours as needed.     labetalol  (NORMODYNE ) 100 MG tablet Take 1 tablet (100 mg total) by mouth every 8 (eight) hours. 90 tablet 0   levocetirizine (XYZAL) 5 MG tablet Take 5 mg by mouth daily.     NIFEdipine  (PROCARDIA -XL/NIFEDICAL-XL) 30 MG 24 hr tablet Take 30 mg by mouth daily.     paragard intrauterine copper IUD IUD      No facility-administered medications prior to visit.   Allergies  Allergen Reactions   Covid-19 (Mrna) Vaccine Hives   Objective:   Today's Vitals   05/07/24 1304  BP: 120/74  Pulse: 92  Temp: (!) 97.1 F (36.2 C)  TempSrc: Temporal  SpO2: 98%  Weight: 175 lb (79.4 kg)  Height: 5' 1 (1.549 m)   Body mass index is 33.07 kg/m.   General: Well developed, well nourished. No acute distress. Abdomen: Soft. Bowel sounds positive, normal pitch and frequency. No hepatosplenomegaly. Mild pain in the right upper   abdomen and more laterally. No rebound or guarding. Psych: Alert and oriented. Normal mood and affect.  Health  Maintenance Due  Topic Date Due   Hepatitis B Vaccines 19-59 Average Risk (1 of 3 - 19+ 3-dose series) Never done   HPV VACCINES (1 - 3-dose SCDM series) Never done   Lab Results:    Latest Ref Rng & Units 05/07/2024    1:38 PM 04/08/2024   11:36 AM 02/18/2024   10:14 AM  CMP  Glucose 70 - 99 mg/dL 95  87  875   BUN 6 - 23 mg/dL 11  11  10    Creatinine 0.40 - 1.20 mg/dL 9.22  9.35  9.31   Sodium 135 - 145 mEq/L 136  138  140   Potassium 3.5 - 5.1 mEq/L 4.3  4.0  4.0   Chloride 96 -  112 mEq/L 99  104  106   CO2 19 - 32 mEq/L 27  21  21    Calcium  8.4 - 10.5 mg/dL 9.6  9.6  89.9   Total Protein 6.0 - 8.3 g/dL 7.7  7.4  7.2   Total Bilirubin 0.2 - 1.2 mg/dL 1.3  0.9  1.1   Alkaline Phos 39 - 117 U/L 77  68  112   AST 0 - 37 U/L 30  42  56   ALT 0 - 35 U/L 42  40  70       Latest Ref Rng & Units 05/07/2024    1:38 PM 04/08/2024   11:36 AM 02/16/2024    4:35 AM  CBC  WBC 4.0 - 10.5 K/uL 6.4  6.3  6.6   Hemoglobin 12.0 - 15.0 g/dL 85.3  85.6  86.4   Hematocrit 36.0 - 46.0 % 44.1  43.5  40.6   Platelets 150.0 - 400.0 K/uL 326.0  324.0  358    Lipase     Component Value Date/Time   LIPASE 54.0 05/07/2024 1338     Assessment & Plan:   Problem List Items Addressed This Visit       Other   Generalized anxiety disorder with panic attacks   Ms. Ciresi mentions episodes of panic. We discussed use of deep breathing exercises. She is engaged with counseling.      History of gestational hypertension - Primary   Ms. Vandervort's blood pressure is normal in the clinic today, though she shows me screen shots of home BPs that remain high at times. I will have her continue use of both her labetalol  100 mg bid and nifedipine  30 mg bid for now. We will reassess this in 1 month. I asked her to bring her home BP monitor in so we can compare its results to a manual BP cuff.      Relevant Orders   Comprehensive metabolic panel with GFR (Completed)   CBC with Differential/Platelet (Completed)   RUQ abdominal pain   In light of her RUQ pain, I recheck her LFTs. Her AST has normalized, but her ALT remains mildly elevated and her bilirubin is up. I will check a RUQ ultrasound to evaluate for gallbladder disease.      Relevant Orders   Comprehensive metabolic panel with GFR (Completed)   Lipase (Completed)   CBC with Differential/Platelet (Completed)   US  ABDOMEN LIMITED RUQ (LIVER/GB)    Return for Follow-up as scheduled.   Garnette CHRISTELLA Simpler, MD

## 2024-05-07 NOTE — Assessment & Plan Note (Signed)
 Diamond Smith blood pressure is normal in the clinic today, though she shows me screen shots of home BPs that remain high at times. I will have her continue use of both her labetalol  100 mg bid and nifedipine  30 mg bid for now. We will reassess this in 1 month. I asked her to bring her home BP monitor in so we can compare its results to a manual BP cuff.

## 2024-05-07 NOTE — Assessment & Plan Note (Signed)
 In light of her RUQ pain, I recheck her LFTs. Her AST has normalized, but her ALT remains mildly elevated and her bilirubin is up. I will check a RUQ ultrasound to evaluate for gallbladder disease.

## 2024-05-07 NOTE — Patient Instructions (Signed)
Deep Breathing Technique ? ?1. Find a comfortable, quiet place to sit or lie down. Choose a spot where you know you won't be disturbed. If sitting, keep your back straight and your feet flat on the floor. Close your eyes. ? ?2. Place one hand on your belly, just below your ribs. Place the other hand on your chest. ? ?3. Take a regular breath. ? ?4. Now take a slow, deep breath over about 6 seconds. Breathe in slowly through your nose. Pay attention as your belly swells up under your hand. ? ?5. Holding your breath, pause for a four to five seconds. ? ?6. Slowly breathe out through your mouth over about 6 seconds. Pay attention as the hand on your belly goes in with the breath. ? ?7. Now add images to your breathing. As you inhale, imagine that the air you're breathing is spreading relaxation and calmness throughout your body. ? ?8. As you exhale, imagine that your breath is whooshing away stress and tension. ? ?9. Try to deep breathe for 6-10 cycles. ? ?

## 2024-05-13 ENCOUNTER — Ambulatory Visit
Admission: RE | Admit: 2024-05-13 | Discharge: 2024-05-13 | Disposition: A | Discharge status: Home / Self Care | Source: Ambulatory Visit | Attending: Family Medicine | Admitting: Family Medicine

## 2024-05-13 DIAGNOSIS — R1011 Right upper quadrant pain: Secondary | ICD-10-CM

## 2024-05-13 DIAGNOSIS — K760 Fatty (change of) liver, not elsewhere classified: Secondary | ICD-10-CM | POA: Diagnosis not present

## 2024-05-21 DIAGNOSIS — Z3202 Encounter for pregnancy test, result negative: Secondary | ICD-10-CM | POA: Diagnosis not present

## 2024-05-21 DIAGNOSIS — Z30430 Encounter for insertion of intrauterine contraceptive device: Secondary | ICD-10-CM | POA: Diagnosis not present

## 2024-05-26 ENCOUNTER — Encounter

## 2024-05-26 ENCOUNTER — Encounter: Payer: Self-pay | Admitting: Family Medicine

## 2024-06-09 ENCOUNTER — Ambulatory Visit: Admitting: Family Medicine

## 2024-06-09 NOTE — Progress Notes (Incomplete)
 [facility] [address] [place] [address] Dept: [phone] Dept Fax: [phone]  Chronic Care Office Visit  Subjective:    Patient ID: Diamond Smith, female    DOB: 1995-11-16, 28 y.o..   MRN: [id number]  No chief complaint on file.  History of Present Illness:  Patient is in today for reassessment of chronic medical conditions.   Diamond Smith delivered her son on 02/06/2024. She had developed gestational hypertension. Subsequently, she developed a HELLP syndrome. She was admitted for monitoring and to initiate antihypertensives. She is currently managed on labetalol  100 mg bid and nifedipine  30 mg bid. At the visit on 9/23, I had recommended she stop her nifedipine . However, she reached out to me on 10/6 noting her home blood pressures were averaging 124/92 and she was having RUQ abdominal pain.   Past Medical History: Patient Active Problem List   Diagnosis Date Noted   Generalized anxiety disorder with panic attacks 05/07/2024   RUQ abdominal pain 05/07/2024   Acne vulgaris 04/08/2024   Eczema 04/08/2024   HELLP syndrome 02/15/2024   Gestational hypertension w/o significant proteinuria in 3rd trimester 02/05/2024   History of gestational hypertension 09/07/2022   Vitamin D deficiency 09/07/2022   Borderline hyperlipidemia 09/07/2022   Overweight (BMI 25.0-29.9) 10/14/2021   Idiopathic urticaria 03/28/2021   Allergic rhinitis 03/28/2021   Low back pain 09/12/2018   Low grade squamous intraepith lesion on cytologic smear cervix (lgsil) 06/21/2018   Gastroesophageal reflux disease without esophagitis 09/14/2017   No past surgical history on file. Family History  Problem Relation Age of Onset   Hyperlipidemia Mother    Hypertension Mother    Hyperlipidemia Father    Hypertension Father    Outpatient Medications Prior to Visit  Medication Sig Dispense Refill   acetaminophen  (TYLENOL ) 325 MG tablet Take 2 tablets  (650 mg total) by mouth every 6 (six) hours as needed (for pain scale < 4).     ibuprofen  (ADVIL ) 200 MG tablet Take 3 tablets (600 mg total) by mouth every 6 (six) hours as needed.     labetalol  (NORMODYNE ) 100 MG tablet Take 1 tablet (100 mg total) by mouth every 8 (eight) hours. 90 tablet 0   levocetirizine (XYZAL) 5 MG tablet Take 5 mg by mouth daily.     NIFEdipine  (PROCARDIA -XL/NIFEDICAL-XL) 30 MG 24 hr tablet Take 30 mg by mouth daily.     paragard intrauterine copper IUD IUD      No facility-administered medications prior to visit.   Allergies  Allergen Reactions   Covid-19 (Mrna) Vaccine Hives     Objective:   There were no vitals filed for this visit. There is no height or weight on file to calculate BMI.   General: Well developed, well nourished. No acute distress. HEENT: Normocephalic, non-traumatic. PERRL, EOMI. Conjunctiva clear. External ears normal. EAC and TMs normal bilaterally. Nose    clear without congestion or rhinorrhea. Mucous membranes moist. Oropharynx clear. Good dentition. Neck: Supple. No lymphadenopathy. No thyromegaly. Lungs: Clear to auscultation bilaterally. No wheezing, rales or rhonchi. CV: RRR without murmurs or rubs. Pulses 2+ bilaterally. Abdomen: Soft, non-tender. Bowel sounds positive, normal pitch and frequency. No hepatosplenomegaly. No rebound or guarding. Back: Straight. No CVA tenderness bilaterally. Extremities: Full ROM. No joint swelling or tenderness. No edema noted. Skin: Warm and dry. No rashes. Neuro: CN II-XII intact. Normal sensation and DTR bilaterally. Psych: Alert and oriented. Normal mood and affect.  Health Maintenance Due  Topic Date Due  Hepatitis B Vaccines 19-59 Average Risk (1 of 3 - 19+ 3-dose series) Never done   HPV VACCINES (1 - 3-dose SCDM series) Never done   COVID-19 Vaccine (4 - 2025-26 season) 03/17/2024    Imaging US  ABDOMEN LIMITED RUQ (LIVER/GB) Result Date: 05/13/2024 CLINICAL DATA:  RIGHT upper  quadrant abdominal pain. 8 weeks postpartum. EXAM: ULTRASOUND ABDOMEN LIMITED RIGHT UPPER QUADRANT COMPARISON:  CT abdomen pelvis 09/09/2018 FINDINGS: Gallbladder: Gallstones: None Sludge: None Gallbladder Wall: Within normal limits Pericholecystic fluid: None Sonographic Murphy's Sign: Negative per technologist Common bile duct: Diameter: 4 mm Liver: Parenchymal echogenicity: Diffusely increased Contours: Normal Lesions: None Portal vein: Patent.  Hepatopetal flow Other: None. IMPRESSION: Diffuse increased echogenicity of the hepatic parenchyma is a nonspecific indicator of hepatocellular dysfunction, most commonly steatosis. Electronically Signed   By: Diamond Smith M.D.   On: 05/13/2024 14:46    Lab Results {Labs (Optional):29002}    Assessment & Plan:   Problem List Items Addressed This Visit   None   No follow-ups on file.   Diamond CHRISTELLA Simpler, MD  I,Diamond Smith,acting as a scribe for Diamond CHRISTELLA Simpler, MD.,have documented all relevant documentation on the behalf of Diamond CHRISTELLA Simpler, MD.  I, Diamond CHRISTELLA Simpler, MD, have reviewed all documentation for this visit. The documentation on 06/09/2024 for the exam, diagnosis, procedures, and orders are all accurate and complete.

## 2024-06-09 NOTE — Assessment & Plan Note (Deleted)
 I will have her continue use of both her labetalol  100 mg bid and nifedipine  30 mg bid for now.

## 2024-07-15 ENCOUNTER — Encounter: Payer: Self-pay | Admitting: Family Medicine

## 2024-07-15 MED ORDER — LABETALOL HCL 100 MG PO TABS: 100.0000 mg | ORAL_TABLET | Freq: Three times a day (TID) | ORAL | 0 refills | Status: AC

## 2024-07-21 ENCOUNTER — Ambulatory Visit: Admitting: Family Medicine

## 2024-07-21 ENCOUNTER — Encounter: Payer: Self-pay | Admitting: Family Medicine

## 2024-07-21 VITALS — BP 127/97 | HR 90 | Temp 98.1°F | Ht 61.0 in | Wt 174.2 lb

## 2024-07-21 DIAGNOSIS — G8929 Other chronic pain: Secondary | ICD-10-CM

## 2024-07-21 DIAGNOSIS — I10 Essential (primary) hypertension: Secondary | ICD-10-CM | POA: Diagnosis not present

## 2024-07-21 DIAGNOSIS — K760 Fatty (change of) liver, not elsewhere classified: Secondary | ICD-10-CM | POA: Insufficient documentation

## 2024-07-21 DIAGNOSIS — M545 Low back pain, unspecified: Secondary | ICD-10-CM | POA: Diagnosis not present

## 2024-07-21 MED ORDER — AMLODIPINE BESYLATE 5 MG PO TABS: 5.0000 mg | ORAL_TABLET | Freq: Every day | ORAL | 3 refills | Status: AC

## 2024-07-21 NOTE — Assessment & Plan Note (Signed)
 I will have her continue use of both her labetalol  100 mg bid and nifedipine  30 mg bid for now.

## 2024-07-21 NOTE — Assessment & Plan Note (Signed)
 Blood pressure is in adequate control. I recommend we switch her from nifedipine  to amlodipine  5 mg daily. I will have her continue her labetalol  100 mg bid for now.

## 2024-07-21 NOTE — Assessment & Plan Note (Signed)
 Appears to be muscular, likely related to carrying her infant. Recommended heat, stretches, and altering how she carries her son.

## 2024-07-21 NOTE — Patient Instructions (Signed)

## 2024-07-21 NOTE — Progress Notes (Signed)
 " [facility] [address] [place] [address] Dept: [phone] Dept Fax: [phone]  Chronic Care Office Visit  Subjective:    Patient ID: Diamond Smith, female    DOB: 02-14-1996, 29 y.o..   MRN: [id number]  Chief Complaint  Patient presents with   Hypertension    2 month f/u HTN.  Average BP at home  114/82, 111/77,  121/85.  C/o having pain off/on in the LT upper abdomen and also some lower back pain.     History of Present Illness:  Patient is in today for reassessment of chronic medical conditions.   Diamond Smith delivered her son on 02/06/2024. Diamond Smith had developed gestational hypertension. Subsequently, Diamond Smith developed a HELLP syndrome. Diamond Smith was admitted for monitoring and to initiate antihypertensives. Diamond Smith is currently managed on labetalol  100 mg bid and nifedipine  30 mg bid. At her visit on 9/23, I had recommended Diamond Smith stop her nifedipine . However, Diamond Smith reached out to me on 10/6 noting her home blood pressures were averaging 124/92 and Diamond Smith was having RUQ abdominal pain. Her evaluation for the abdominal pain showed some steatosis, but no other cause. Diamond Smith continues to have occasional pain with certain movements. Diamond Smith is also noting some right lower back pain. Diamond Smith wonders if this is related ot how Diamond Smith holds her son. Diamond Smith is monitoring her BP at home. Diamond Smith has seen values ranging from 111-121/77-85.   Past Medical History: Patient Active Problem List   Diagnosis Date Noted   Generalized anxiety disorder with panic attacks 05/07/2024   RUQ abdominal pain 05/07/2024   Acne vulgaris 04/08/2024   Eczema 04/08/2024   HELLP syndrome 02/15/2024   Gestational hypertension w/o significant proteinuria in 3rd trimester 02/05/2024   History of gestational hypertension 09/07/2022   Vitamin D deficiency 09/07/2022   Borderline hyperlipidemia 09/07/2022   Overweight (BMI 25.0-29.9) 10/14/2021   Idiopathic urticaria 03/28/2021   Allergic rhinitis  03/28/2021   Low back pain 09/12/2018   Low grade squamous intraepith lesion on cytologic smear cervix (lgsil) 06/21/2018   Gastroesophageal reflux disease without esophagitis 09/14/2017   No past surgical history on file. Family History  Problem Relation Age of Onset   Hyperlipidemia Mother    Hypertension Mother    Hyperlipidemia Father    Hypertension Father    Outpatient Medications Prior to Visit  Medication Sig Dispense Refill   acetaminophen  (TYLENOL ) 325 MG tablet Take 2 tablets (650 mg total) by mouth every 6 (six) hours as needed (for pain scale < 4).     ibuprofen  (ADVIL ) 200 MG tablet Take 3 tablets (600 mg total) by mouth every 6 (six) hours as needed.     labetalol  (NORMODYNE ) 100 MG tablet Take 1 tablet (100 mg total) by mouth every 8 (eight) hours. 90 tablet 0   levocetirizine (XYZAL) 5 MG tablet Take 5 mg by mouth daily.     NIFEdipine  (PROCARDIA -XL/NIFEDICAL-XL) 30 MG 24 hr tablet Take 30 mg by mouth daily.     paragard intrauterine copper IUD IUD      No facility-administered medications prior to visit.   Allergies[1]   Objective:   Today's Vitals   07/21/24 1340 07/21/24 1347 07/21/24 1414  BP: 130/82 (!) 145/100 (!) 127/97  Pulse: 90    Temp: 98.1 F (36.7 C)    TempSrc: Temporal    SpO2: 100%    Weight: 174 lb 3.2 oz (79 kg)    Height: 5' 1 (1.549 m)     Body mass  index is 32.91 kg/m.   General: Well developed, well nourished. No acute distress. Back: Pain noted over paraspinal muscles on the right, down to the pelvic rim. Psych: Alert and oriented. Normal mood and affect.  Health Maintenance Due  Topic Date Due   Hepatitis B Vaccines 19-59 Average Risk (1 of 3 - 19+ 3-dose series) Never done   HPV VACCINES (1 - 3-dose SCDM series) Never done   COVID-19 Vaccine (4 - 2025-26 season) 03/17/2024   Imaging US  ABDOMEN LIMITED RUQ (LIVER/GB) Result Date: 05/13/2024 IMPRESSION: Diffuse increased echogenicity of the hepatic parenchyma is a  nonspecific indicator of hepatocellular dysfunction, most commonly steatosis.   Lab Results    Latest Ref Rng & Units 05/07/2024    1:38 PM 04/08/2024   11:36 AM 02/16/2024    4:35 AM  CBC  WBC 4.0 - 10.5 K/uL 6.4  6.3  6.6   Hemoglobin 12.0 - 15.0 g/dL 85.3  85.6  86.4   Hematocrit 36.0 - 46.0 % 44.1  43.5  40.6   Platelets 150.0 - 400.0 K/uL 326.0  324.0  358       Latest Ref Rng & Units 05/07/2024    1:38 PM 04/08/2024   11:36 AM 02/18/2024   10:14 AM  CMP  Glucose 70 - 99 mg/dL 95  87  875   BUN 6 - 23 mg/dL 11  11  10    Creatinine 0.40 - 1.20 mg/dL 9.22  9.35  9.31   Sodium 135 - 145 mEq/L 136  138  140   Potassium 3.5 - 5.1 mEq/L 4.3  4.0  4.0   Chloride 96 - 112 mEq/L 99  104  106   CO2 19 - 32 mEq/L 27  21  21    Calcium  8.4 - 10.5 mg/dL 9.6  9.6  89.9   Total Protein 6.0 - 8.3 g/dL 7.7  7.4  7.2   Total Bilirubin 0.2 - 1.2 mg/dL 1.3  0.9  1.1   Alkaline Phos 39 - 117 U/L 77  68  112   AST 0 - 37 U/L 30  42  56   ALT 0 - 35 U/L 42  40  70      Assessment & Plan:   Problem List Items Addressed This Visit       Cardiovascular and Mediastinum   Essential hypertension - Primary   Blood pressure is in adequate control. I recommend we switch her from nifedipine  to amlodipine  5 mg daily. I will have her continue her labetalol  100 mg bid for now.      Relevant Medications   amLODipine  (NORVASC ) 5 MG tablet     Digestive   Hepatic steatosis   Likely developed as a consequence of her pregnancy. I urged her to focus on weight loss through restricted calories and regular exercise.        Other   Low back pain   Appears to be muscular, likely related to carrying her infant. Recommended heat, stretches, and altering how Diamond Smith carries her son.       Return in about 3 months (around 10/19/2024) for Reassessment.   Garnette CHRISTELLA Simpler, MD  I,Emily Lagle,acting as a scribe for Garnette CHRISTELLA Simpler, MD.,have documented all relevant documentation on the behalf of Garnette CHRISTELLA Simpler,  MD.  I, Garnette CHRISTELLA Simpler, MD, have reviewed all documentation for this visit. The documentation on 07/21/2024 for the exam, diagnosis, procedures, and orders are all accurate and complete.     [1]  Allergies Allergen Reactions   Covid-19 (Mrna) Vaccine Hives   "

## 2024-07-21 NOTE — Assessment & Plan Note (Signed)
 Likely developed as a consequence of her pregnancy. I urged her to focus on weight loss through restricted calories and regular exercise.

## 2024-08-18 ENCOUNTER — Encounter: Payer: Self-pay | Admitting: Family Medicine

## 2024-10-21 ENCOUNTER — Ambulatory Visit: Admitting: Family Medicine
